# Patient Record
Sex: Female | Born: 1995 | Race: Black or African American | Hispanic: No | Marital: Single | Smoking: Never smoker
Health system: Southern US, Community
[De-identification: ages and names within clinical notes are randomized; demographics above are authoritative.]

## PROBLEM LIST (undated history)

## (undated) DIAGNOSIS — I10 Essential (primary) hypertension: Secondary | ICD-10-CM

## (undated) HISTORY — PX: TONSILLECTOMY: SUR1361

## (undated) HISTORY — PX: MOUTH SURGERY: SHX715

---

## 2013-07-26 ENCOUNTER — Encounter (HOSPITAL_BASED_OUTPATIENT_CLINIC_OR_DEPARTMENT_OTHER): Payer: Self-pay | Admitting: Emergency Medicine

## 2013-07-26 ENCOUNTER — Emergency Department (HOSPITAL_BASED_OUTPATIENT_CLINIC_OR_DEPARTMENT_OTHER)
Admission: EM | Admit: 2013-07-26 | Discharge: 2013-07-26 | Disposition: A | Discharge status: Home / Self Care | Payer: Medicaid Other | Attending: Emergency Medicine | Admitting: Emergency Medicine

## 2013-07-26 ENCOUNTER — Emergency Department (HOSPITAL_BASED_OUTPATIENT_CLINIC_OR_DEPARTMENT_OTHER): Payer: Medicaid Other

## 2013-07-26 DIAGNOSIS — R1084 Generalized abdominal pain: Secondary | ICD-10-CM | POA: Insufficient documentation

## 2013-07-26 DIAGNOSIS — Z3490 Encounter for supervision of normal pregnancy, unspecified, unspecified trimester: Secondary | ICD-10-CM

## 2013-07-26 DIAGNOSIS — O9989 Other specified diseases and conditions complicating pregnancy, childbirth and the puerperium: Secondary | ICD-10-CM | POA: Insufficient documentation

## 2013-07-26 LAB — PREGNANCY, URINE: Preg Test, Ur: POSITIVE — AB

## 2013-07-26 LAB — URINALYSIS, ROUTINE W REFLEX MICROSCOPIC
Bilirubin Urine: NEGATIVE
Glucose, UA: NEGATIVE mg/dL
HGB URINE DIPSTICK: NEGATIVE
Ketones, ur: NEGATIVE mg/dL
Leukocytes, UA: NEGATIVE
Nitrite: NEGATIVE
PROTEIN: NEGATIVE mg/dL
Specific Gravity, Urine: 1.017 (ref 1.005–1.030)
Urobilinogen, UA: 0.2 mg/dL (ref 0.0–1.0)
pH: 7.5 (ref 5.0–8.0)

## 2013-07-26 LAB — HCG, QUANTITATIVE, PREGNANCY: hCG, Beta Chain, Quant, S: 2116 m[IU]/mL — ABNORMAL HIGH (ref <5)

## 2013-07-26 MED ORDER — PRENATAL COMPLETE 14-0.4 MG PO TABS: 1.0000 | ORAL_TABLET | Freq: Every morning | ORAL | Status: DC

## 2013-07-26 NOTE — ED Notes (Addendum)
Pt reports has had abd pain in right pelvic area, nausea, and right flank pain.  Has had subjective fever and ha.  Also reports has had whitish vaginal discharge.

## 2013-07-26 NOTE — ED Notes (Addendum)
PA at bedside giving detailed Dc instructions.

## 2013-07-26 NOTE — ED Provider Notes (Signed)
CSN: [id number]     Arrival date & time 07/26/13  1314 History   First MD Initiated Contact with Patient 07/26/13 1344     Chief Complaint  Patient presents with  . Pelvic Pain   (Consider location/radiation/quality/duration/timing/severity/associated sxs/prior Treatment) Patient is a 18 y.o. female presenting with abdominal pain. The history is provided by the patient. No language interpreter was used.  Abdominal Pain Pain location:  Generalized Pain quality: aching   Pain radiates to:  Does not radiate Pain severity:  No pain Timing:  Constant Progression:  Worsening Chronicity:  New Relieved by:  Nothing Worsened by:  Nothing tried Associated symptoms: no vomiting   Risk factors: pregnancy     History reviewed. No pertinent past medical history. Past Surgical History  Procedure Laterality Date  . Tonsillectomy     No family history on file. History  Substance Use Topics  . Smoking status: Never Smoker   . Smokeless tobacco: Not on file  . Alcohol Use: No   OB History   Grav Para Term Preterm Abortions TAB SAB Ect Mult Living                 Review of Systems  Gastrointestinal: Positive for abdominal pain. Negative for vomiting.  Genitourinary: Negative for flank pain and vaginal pain.  All other systems reviewed and are negative.    Allergies  Review of patient's allergies indicates no known allergies.  Home Medications  No current outpatient prescriptions on file. BP 148/74  Pulse 87  Temp(Src) 97.6 F (36.4 C) (Oral)  Resp 18  Ht 5\' 3"  (1.6 m)  SpO2 100%  LMP 06/20/2013 Physical Exam  Nursing note and vitals reviewed. Constitutional: She is oriented to person, place, and time. She appears well-developed and well-nourished.  HENT:  Head: Normocephalic and atraumatic.  Right Ear: External ear normal.  Left Ear: External ear normal.  Eyes: Conjunctivae and EOM are normal. Pupils are equal, round, and reactive to light.  Neck: Normal range of  motion. Neck supple.  Pulmonary/Chest: Effort normal and breath sounds normal.  Abdominal: Soft. She exhibits no distension.  Musculoskeletal: Normal range of motion.  Neurological: She is alert and oriented to person, place, and time. She has normal reflexes.  Skin: Skin is warm.  Psychiatric: She has a normal mood and affect.    ED Course  Procedures (including critical care time) Labs Review Labs Reviewed  PREGNANCY, URINE - Abnormal; Notable for the following:    Preg Test, Ur POSITIVE (*)    All other components within normal limits  URINALYSIS, ROUTINE W REFLEX MICROSCOPIC   Imaging Review No results found.  EKG Interpretation   None       MDM   1. Pregnancy    hcg 2116   Ultrasound shows 5w 2 day.   Pt advised of need for followup ultrasound.   [facility]  Lonia SkinnerLeslie K BataviaSofia, PA-C 07/26/13 [phone]

## 2013-07-26 NOTE — ED Notes (Signed)
Patient transported to u/s ambulatory with tech.

## 2013-07-26 NOTE — Discharge Instructions (Signed)

## 2013-07-26 NOTE — ED Provider Notes (Signed)
Medical screening examination/treatment/procedure(s) were performed by non-physician practitioner and as supervising physician I was immediately available for consultation/collaboration.   Gabriele Loveland L Sejla Marzano, MD 07/26/13 2137 

## 2014-08-01 ENCOUNTER — Emergency Department (HOSPITAL_BASED_OUTPATIENT_CLINIC_OR_DEPARTMENT_OTHER)
Admission: EM | Admit: 2014-08-01 | Discharge: 2014-08-01 | Disposition: A | Discharge status: Home / Self Care | Payer: Medicaid Other | Attending: Emergency Medicine | Admitting: Emergency Medicine

## 2014-08-01 ENCOUNTER — Encounter (HOSPITAL_BASED_OUTPATIENT_CLINIC_OR_DEPARTMENT_OTHER): Payer: Self-pay

## 2014-08-01 DIAGNOSIS — N898 Other specified noninflammatory disorders of vagina: Secondary | ICD-10-CM | POA: Diagnosis present

## 2014-08-01 DIAGNOSIS — I10 Essential (primary) hypertension: Secondary | ICD-10-CM | POA: Insufficient documentation

## 2014-08-01 DIAGNOSIS — N760 Acute vaginitis: Secondary | ICD-10-CM | POA: Insufficient documentation

## 2014-08-01 DIAGNOSIS — Z3202 Encounter for pregnancy test, result negative: Secondary | ICD-10-CM | POA: Insufficient documentation

## 2014-08-01 DIAGNOSIS — B9689 Other specified bacterial agents as the cause of diseases classified elsewhere: Secondary | ICD-10-CM

## 2014-08-01 LAB — URINALYSIS, ROUTINE W REFLEX MICROSCOPIC
Bilirubin Urine: NEGATIVE
Glucose, UA: NEGATIVE mg/dL
Hgb urine dipstick: NEGATIVE
Ketones, ur: NEGATIVE mg/dL
Leukocytes, UA: NEGATIVE
Nitrite: NEGATIVE
Protein, ur: NEGATIVE mg/dL
SPECIFIC GRAVITY, URINE: 1.035 — AB (ref 1.005–1.030)
Urobilinogen, UA: 0.2 mg/dL (ref 0.0–1.0)
pH: 6.5 (ref 5.0–8.0)

## 2014-08-01 LAB — WET PREP, GENITAL
Trich, Wet Prep: NONE SEEN
Yeast Wet Prep HPF POC: NONE SEEN

## 2014-08-01 LAB — PREGNANCY, URINE: Preg Test, Ur: NEGATIVE

## 2014-08-01 MED ORDER — METRONIDAZOLE 500 MG PO TABS: 500.0000 mg | ORAL_TABLET | Freq: Two times a day (BID) | ORAL | Status: DC

## 2014-08-01 NOTE — ED Provider Notes (Signed)
CSN: [id number]     Arrival date & time 08/01/14  1505 History   First MD Initiated Contact with Patient 08/01/14 1607     Chief Complaint  Patient presents with  . Vaginal Discharge     (Consider location/radiation/quality/duration/timing/severity/associated sxs/prior Treatment) Patient is a 19 y.o. female presenting with vaginal discharge.  Vaginal Discharge Quality:  White, thick and malodorous Severity:  Mild Onset quality:  Gradual Duration:  1 week Timing:  Intermittent Progression:  Unchanged Chronicity:  New Context: at rest   Context: not after intercourse, not after urination, not during bowel movement, not during intercourse, not during pregnancy, not during urination, not genital trauma, not recent antibiotic use and not spontaneously   Relieved by:  Nothing Worsened by:  Nothing tried Ineffective treatments:  None tried Associated symptoms: no abdominal pain, no dyspareunia, no dysuria, no fever, no genital lesions, no nausea, no rash, no urinary frequency, no urinary hesitancy, no urinary incontinence, no vaginal itching and no vomiting   Risk factors: unprotected sex       No past medical history on file. Past Surgical History  Procedure Laterality Date  . Tonsillectomy     No family history on file. History  Substance Use Topics  . Smoking status: Never Smoker   . Smokeless tobacco: Not on file  . Alcohol Use: No   OB History    No data available     Review of Systems  Constitutional: Negative for fever and chills.  HENT: Negative for trouble swallowing.   Respiratory: Negative for shortness of breath.   Cardiovascular: Negative for chest pain.  Gastrointestinal: Negative for nausea, vomiting, abdominal pain, diarrhea and constipation.  Genitourinary: Positive for vaginal discharge. Negative for bladder incontinence, dysuria, hesitancy, hematuria and dyspareunia.  Musculoskeletal: Negative for myalgias and arthralgias.  Skin: Negative for rash.   Neurological: Negative for numbness.  Psychiatric/Behavioral: Negative.   All other systems reviewed and are negative.     Allergies  Review of patient's allergies indicates no known allergies.  Home Medications   Prior to Admission medications   Not on File   BP 162/101 mmHg  Pulse 80  Temp(Src) 98.9 F (37.2 C) (Oral)  Resp 16  Ht 5\' 3"  (1.6 m)  Wt 220 lb (99.791 kg)  BMI 38.98 kg/m2  SpO2 100%  LMP 07/04/2014 Physical Exam  Constitutional: She is oriented to person, place, and time. She appears well-developed and well-nourished. No distress.  HENT:  Head: Normocephalic and atraumatic.  Eyes: Conjunctivae are normal. No scleral icterus.  Neck: Normal range of motion.  Cardiovascular: Normal rate, regular rhythm and normal heart sounds.  Exam reveals no gallop and no friction rub.   No murmur heard. Pulmonary/Chest: Effort normal and breath sounds normal. No respiratory distress.  Abdominal: Soft. Bowel sounds are normal. She exhibits no distension and no mass. There is no tenderness. There is no guarding.  Genitourinary:  Pelvic exam: normal external genitalia, vulva, vagina, cervix, uterus and adnexa.   Neurological: She is alert and oriented to person, place, and time.  Skin: Skin is warm and dry. She is not diaphoretic.    ED Course  Procedures (including critical care time) Labs Review Labs Reviewed  WET PREP, GENITAL - Abnormal; Notable for the following:    Clue Cells Wet Prep HPF POC MANY (*)    WBC, Wet Prep HPF POC FEW (*)    All other components within normal limits  URINALYSIS, ROUTINE W REFLEX MICROSCOPIC - Abnormal; Notable for the following:  Specific Gravity, Urine 1.035 (*)    All other components within normal limits  PREGNANCY, URINE  HIV ANTIBODY (ROUTINE TESTING)  RPR  GC/CHLAMYDIA PROBE AMP ([place])    Imaging Review No results found.   EKG Interpretation None      MDM   Final diagnoses:  None   Patient with  benign pelvic examination. She is hypertensive. She'll need follow-up with primary care physician for this. He shows bacterial vaginosis. No signs of STD on examination. Urine is without signs of infection. She appears safe for discharge at this time.      Arthor Captain, PA-C 08/02/14 1556  Vanetta Mulders, MD 08/04/14 2151

## 2014-08-01 NOTE — Discharge Instructions (Signed)
You haven't been diagnosed today with bacterial vaginosis. Your gonorrhea and chlamydia tests are still pending. There is no wheezing on my examination to feel that you have these and so you are not going to be treated for them today. Bacterial vaginosis is not a sexually transmitted disease. It is caused by an overgrowth of bacteria that normally in the vagina. This can happen due to a change in the pH balance of the vagina. It can be caused by bleeding, sexual intercourse, medications. I'm discharging you with a medication that should never be taken with any alcohol. Is imperative that you do not drink alcohol during the treatment. Please follow-up your OB/GYN provider for further treatment if necessary. He was also found to be with very high blood pressure today. He must follow-up for further treatment. More information as below.  Bacterial Vaginosis Bacterial vaginosis is a vaginal infection that occurs when the normal balance of bacteria in the vagina is disrupted. It results from an overgrowth of certain bacteria. This is the most common vaginal infection in women of childbearing age. Treatment is important to prevent complications, especially in pregnant women, as it can cause a premature delivery. CAUSES  Bacterial vaginosis is caused by an increase in harmful bacteria that are normally present in smaller amounts in the vagina. Several different kinds of bacteria can cause bacterial vaginosis. However, the reason that the condition develops is not fully understood. RISK FACTORS Certain activities or behaviors can put you at an increased risk of developing bacterial vaginosis, including:  Having a new sex partner or multiple sex partners.  Douching.  Using an intrauterine device (IUD) for contraception. Women do not get bacterial vaginosis from toilet seats, bedding, swimming pools, or contact with objects around them. SIGNS AND SYMPTOMS  Some women with bacterial vaginosis have no signs or  symptoms. Common symptoms include:  Grey vaginal discharge.  A fishlike odor with discharge, especially after sexual intercourse.  Itching or burning of the vagina and vulva.  Burning or pain with urination. DIAGNOSIS  Your health care provider will take a medical history and examine the vagina for signs of bacterial vaginosis. A sample of vaginal fluid may be taken. Your health care provider will look at this sample under a microscope to check for bacteria and abnormal cells. A vaginal pH test may also be done.  TREATMENT  Bacterial vaginosis may be treated with antibiotic medicines. These may be given in the form of a pill or a vaginal cream. A second round of antibiotics may be prescribed if the condition comes back after treatment.  HOME CARE INSTRUCTIONS   Only take over-the-counter or prescription medicines as directed by your health care provider.  If antibiotic medicine was prescribed, take it as directed. Make sure you finish it even if you start to feel better.  Do not have sex until treatment is completed.  Tell all sexual partners that you have a vaginal infection. They should see their health care provider and be treated if they have problems, such as a mild rash or itching.  Practice safe sex by using condoms and only having one sex partner. SEEK MEDICAL CARE IF:   Your symptoms are not improving after 3 days of treatment.  You have increased discharge or pain.  You have a fever. MAKE SURE YOU:   Understand these instructions.  Will watch your condition.  Will get help right away if you are not doing well or get worse. FOR MORE INFORMATION  Centers for Disease Control  and Prevention, Division of STD Prevention: SolutionApps.co.zawww.cdc.gov/std American Sexual Health Association (ASHA): www.ashastd.org  Document Released: 06/22/2005 Document Revised: 04/12/2013 Document Reviewed: 02/01/2013 [facility] Patient Information 2015 [place], [place]. This information is not intended to  replace advice given to you by your health care provider. Make sure you discuss any questions you have with your health care provider. Metronidazole tablets or capsules What is this medicine? METRONIDAZOLE (me troe NI da zole) is an antiinfective. It is used to treat certain kinds of bacterial and protozoal infections. It will not work for colds, flu, or other viral infections. This medicine may be used for other purposes; ask your health care provider or pharmacist if you have questions. COMMON BRAND NAME(S): Flagyl What should I tell my health care provider before I take this medicine? They need to know if you have any of these conditions: -anemia or other blood disorders -disease of the nervous system -fungal or yeast infection -if you drink alcohol containing drinks -liver disease -seizures -an unusual or allergic reaction to metronidazole, or other medicines, foods, dyes, or preservatives -pregnant or trying to get pregnant -breast-feeding How should I use this medicine? Take this medicine by mouth with a full glass of water. Follow the directions on the prescription label. Take your medicine at regular intervals. Do not take your medicine more often than directed. Take all of your medicine as directed even if you think you are better. Do not skip doses or stop your medicine early. Talk to your pediatrician regarding the use of this medicine in children. Special care may be needed. Overdosage: If you think you have taken too much of this medicine contact a poison control center or emergency room at once. NOTE: This medicine is only for you. Do not share this medicine with others. What if I miss a dose? If you miss a dose, take it as soon as you can. If it is almost time for your next dose, take only that dose. Do not take double or extra doses. What may interact with this medicine? Do not take this medicine with any of the following medications: -alcohol or any product that contains  alcohol -amprenavir oral solution -cisapride -disulfiram -dofetilide -dronedarone -paclitaxel injection -pimozide -ritonavir oral solution -sertraline oral solution -sulfamethoxazole-trimethoprim injection -thioridazine -ziprasidone This medicine may also interact with the following medications: -birth control pills -cimetidine -lithium -other medicines that prolong the QT interval (cause an abnormal heart rhythm) -phenobarbital -phenytoin -warfarin This list may not describe all possible interactions. Give your health care provider a list of all the medicines, herbs, non-prescription drugs, or dietary supplements you use. Also tell them if you smoke, drink alcohol, or use illegal drugs. Some items may interact with your medicine. What should I watch for while using this medicine? Tell your doctor or health care professional if your symptoms do not improve or if they get worse. You may get drowsy or dizzy. Do not drive, use machinery, or do anything that needs mental alertness until you know how this medicine affects you. Do not stand or sit up quickly, especially if you are an older patient. This reduces the risk of dizzy or fainting spells. Avoid alcoholic drinks while you are taking this medicine and for three days afterward. Alcohol may make you feel dizzy, sick, or flushed. If you are being treated for a sexually transmitted disease, avoid sexual contact until you have finished your treatment. Your sexual partner may also need treatment. What side effects may I notice from receiving this medicine? Side effects  that you should report to your doctor or health care professional as soon as possible: -allergic reactions like skin rash or hives, swelling of the face, lips, or tongue -confusion, clumsiness -difficulty speaking -discolored or sore mouth -dizziness -fever, infection -numbness, tingling, pain or weakness in the hands or feet -trouble passing urine or change in the  amount of urine -redness, blistering, peeling or loosening of the skin, including inside the mouth -seizures -unusually weak or tired -vaginal irritation, dryness, or discharge Side effects that usually do not require medical attention (report to your doctor or health care professional if they continue or are bothersome): -diarrhea -headache -irritability -metallic taste -nausea -stomach pain or cramps -trouble sleeping This list may not describe all possible side effects. Call your doctor for medical advice about side effects. You may report side effects to FDA at 1-800-FDA-1088. Where should I keep my medicine? Keep out of the reach of children. Store at room temperature below 25 degrees C (77 degrees F). Protect from light. Keep container tightly closed. Throw away any unused medicine after the expiration date. NOTE: This sheet is a summary. It may not cover all possible information. If you have questions about this medicine, talk to your doctor, pharmacist, or health care provider.  2015, Elsevier/Gold Standard. (2013-01-27 14:08:39)  DASH Eating Plan DASH stands for "Dietary Approaches to Stop Hypertension." The DASH eating plan is a healthy eating plan that has been shown to reduce high blood pressure (hypertension). Additional health benefits may include reducing the risk of type 2 diabetes mellitus, heart disease, and stroke. The DASH eating plan may also help with weight loss. WHAT DO I NEED TO KNOW ABOUT THE DASH EATING PLAN? For the DASH eating plan, you will follow these general guidelines:  Choose foods with a percent daily value for sodium of less than 5% (as listed on the food label).  Use salt-free seasonings or herbs instead of table salt or sea salt.  Check with your health care provider or pharmacist before using salt substitutes.  Eat lower-sodium products, often labeled as "lower sodium" or "no salt added."  Eat fresh foods.  Eat more vegetables, fruits, and  low-fat dairy products.  Choose whole grains. Look for the word "whole" as the first word in the ingredient list.  Choose fish and skinless chicken or [place] more often than red meat. Limit fish, poultry, and meat to 6 oz (170 g) each day.  Limit sweets, desserts, sugars, and sugary drinks.  Choose heart-healthy fats.  Limit cheese to 1 oz (28 g) per day.  Eat more home-cooked food and less restaurant, buffet, and fast food.  Limit fried foods.  Cook foods using methods other than frying.  Limit canned vegetables. If you do use them, rinse them well to decrease the sodium.  When eating at a restaurant, ask that your food be prepared with less salt, or no salt if possible. WHAT FOODS CAN I EAT? Seek help from a dietitian for individual calorie needs. Grains Whole grain or whole wheat bread. Brown rice. Whole grain or whole wheat pasta. Quinoa, bulgur, and whole grain cereals. Low-sodium cereals. Corn or whole wheat flour tortillas. Whole grain cornbread. Whole grain crackers. Low-sodium crackers. Vegetables Fresh or frozen vegetables (raw, steamed, roasted, or grilled). Low-sodium or reduced-sodium tomato and vegetable juices. Low-sodium or reduced-sodium tomato sauce and paste. Low-sodium or reduced-sodium canned vegetables.  Fruits All fresh, canned (in natural juice), or frozen fruits. Meat and Other Protein Products Ground beef (85% or leaner), grass-fed beef,  or beef trimmed of fat. Skinless chicken or [place]. Ground chicken or [place]. Pork trimmed of fat. All fish and seafood. Eggs. Dried beans, peas, or lentils. Unsalted nuts and seeds. Unsalted canned beans. Dairy Low-fat dairy products, such as skim or 1% milk, 2% or reduced-fat cheeses, low-fat ricotta or cottage cheese, or plain low-fat yogurt. Low-sodium or reduced-sodium cheeses. Fats and Oils Tub margarines without trans fats. Light or reduced-fat mayonnaise and salad dressings (reduced sodium). Avocado. Safflower,  olive, or canola oils. Natural peanut or almond butter. Other Unsalted popcorn and pretzels. The items listed above may not be a complete list of recommended foods or beverages. Contact your dietitian for more options. WHAT FOODS ARE NOT RECOMMENDED? Grains White bread. White pasta. White rice. Refined cornbread. Bagels and croissants. Crackers that contain trans fat. Vegetables Creamed or fried vegetables. Vegetables in a cheese sauce. Regular canned vegetables. Regular canned tomato sauce and paste. Regular tomato and vegetable juices. Fruits Dried fruits. Canned fruit in light or heavy syrup. Fruit juice. Meat and Other Protein Products Fatty cuts of meat. Ribs, chicken wings, bacon, sausage, bologna, salami, chitterlings, fatback, hot dogs, bratwurst, and packaged luncheon meats. Salted nuts and seeds. Canned beans with salt. Dairy Whole or 2% milk, cream, half-and-half, and cream cheese. Whole-fat or sweetened yogurt. Full-fat cheeses or blue cheese. Nondairy creamers and whipped toppings. Processed cheese, cheese spreads, or cheese curds. Condiments Onion and garlic salt, seasoned salt, table salt, and sea salt. Canned and packaged gravies. Worcestershire sauce. Tartar sauce. Barbecue sauce. Teriyaki sauce. Soy sauce, including reduced sodium. Steak sauce. Fish sauce. Oyster sauce. Cocktail sauce. Horseradish. Ketchup and mustard. Meat flavorings and tenderizers. Bouillon cubes. Hot sauce. Tabasco sauce. Marinades. Taco seasonings. Relishes. Fats and Oils Butter, stick margarine, lard, shortening, ghee, and bacon fat. Coconut, palm kernel, or palm oils. Regular salad dressings. Other Pickles and olives. Salted popcorn and pretzels. The items listed above may not be a complete list of foods and beverages to avoid. Contact your dietitian for more information. WHERE CAN I FIND MORE INFORMATION? National Heart, Lung, and Blood Institute:  CablePromo.it Document Released: 06/11/2011 Document Revised: 11/06/2013 Document Reviewed: 04/26/2013 [facility] Patient Information 2015 [place], [place]. This information is not intended to replace advice given to you by your health care provider. Make sure you discuss any questions you have with your health care provider.  Hypertension Hypertension, commonly called high blood pressure, is when the force of blood pumping through your arteries is too strong. Your arteries are the blood vessels that carry blood from your heart throughout your body. A blood pressure reading consists of a higher number over a lower number, such as 110/72. The higher number (systolic) is the pressure inside your arteries when your heart pumps. The lower number (diastolic) is the pressure inside your arteries when your heart relaxes. Ideally you want your blood pressure below 120/80. Hypertension forces your heart to work harder to pump blood. Your arteries may become narrow or stiff. Having hypertension puts you at risk for heart disease, stroke, and other problems.  RISK FACTORS Some risk factors for high blood pressure are controllable. Others are not.  Risk factors you cannot control include:   Race. You may be at higher risk if you are African American.  Age. Risk increases with age.  Gender. Men are at higher risk than women before age 74 years. After age 12, women are at higher risk than men. Risk factors you can control include:  Not getting enough exercise or physical activity.  Being overweight.  Getting too much fat, sugar, calories, or salt in your diet.  Drinking too much alcohol. SIGNS AND SYMPTOMS Hypertension does not usually cause signs or symptoms. Extremely high blood pressure (hypertensive crisis) may cause headache, anxiety, shortness of breath, and nosebleed. DIAGNOSIS  To check if you have hypertension, your health care provider will measure your  blood pressure while you are seated, with your arm held at the level of your heart. It should be measured at least twice using the same arm. Certain conditions can cause a difference in blood pressure between your right and left arms. A blood pressure reading that is higher than normal on one occasion does not mean that you need treatment. If one blood pressure reading is high, ask your health care provider about having it checked again. TREATMENT  Treating high blood pressure includes making lifestyle changes and possibly taking medicine. Living a healthy lifestyle can help lower high blood pressure. You may need to change some of your habits. Lifestyle changes may include:  Following the DASH diet. This diet is high in fruits, vegetables, and whole grains. It is low in salt, red meat, and added sugars.  Getting at least 2 hours of brisk physical activity every week.  Losing weight if necessary.  Not smoking.  Limiting alcoholic beverages.  Learning ways to reduce stress. If lifestyle changes are not enough to get your blood pressure under control, your health care provider may prescribe medicine. You may need to take more than one. Work closely with your health care provider to understand the risks and benefits. HOME CARE INSTRUCTIONS  Have your blood pressure rechecked as directed by your health care provider.   Take medicines only as directed by your health care provider. Follow the directions carefully. Blood pressure medicines must be taken as prescribed. The medicine does not work as well when you skip doses. Skipping doses also puts you at risk for problems.   Do not smoke.   Monitor your blood pressure at home as directed by your health care provider. SEEK MEDICAL CARE IF:   You think you are having a reaction to medicines taken.  You have recurrent headaches or feel dizzy.  You have swelling in your ankles.  You have trouble with your vision. SEEK IMMEDIATE MEDICAL  CARE IF:  You develop a severe headache or confusion.  You have unusual weakness, numbness, or feel faint.  You have severe chest or abdominal pain.  You vomit repeatedly.  You have trouble breathing. MAKE SURE YOU:   Understand these instructions.  Will watch your condition.  Will get help right away if you are not doing well or get worse. Document Released: 06/22/2005 Document Revised: 11/06/2013 Document Reviewed: 04/14/2013 [facility] Patient Information 2015 [place], [place]. This information is not intended to replace advice given to you by your health care provider. Make sure you discuss any questions you have with your health care provider.  Managing Your High Blood Pressure Blood pressure is a measurement of how forceful your blood is pressing against the walls of the arteries. Arteries are muscular tubes within the circulatory system. Blood pressure does not stay the same. Blood pressure rises when you are active, excited, or nervous; and it lowers during sleep and relaxation. If the numbers measuring your blood pressure stay above normal most of the time, you are at risk for health problems. High blood pressure (hypertension) is a long-term (chronic) condition in which blood pressure is elevated. A blood pressure reading is recorded  as two numbers, such as 120 over 80 (or 120/80). The first, higher number is called the systolic pressure. It is a measure of the pressure in your arteries as the heart beats. The second, lower number is called the diastolic pressure. It is a measure of the pressure in your arteries as the heart relaxes between beats.  Keeping your blood pressure in a normal range is important to your overall health and prevention of health problems, such as heart disease and stroke. When your blood pressure is uncontrolled, your heart has to work harder than normal. High blood pressure is a very common condition in adults because blood pressure tends to rise with age. Men  and women are equally likely to have hypertension but at different times in life. Before age 61, men are more likely to have hypertension. After 21 years of age, women are more likely to have it. Hypertension is especially common in African Americans. This condition often has no signs or symptoms. The cause of the condition is usually not known. Your caregiver can help you come up with a plan to keep your blood pressure in a normal, healthy range. BLOOD PRESSURE STAGES Blood pressure is classified into four stages: normal, prehypertension, stage 1, and stage 2. Your blood pressure reading will be used to determine what type of treatment, if any, is necessary. Appropriate treatment options are tied to these four stages:  Normal  Systolic pressure (mm Hg): below 120.  Diastolic pressure (mm Hg): below 80. Prehypertension  Systolic pressure (mm Hg): 120 to 139.  Diastolic pressure (mm Hg): 80 to 89. Stage1  Systolic pressure (mm Hg): 140 to 159.  Diastolic pressure (mm Hg): 90 to 99. Stage2  Systolic pressure (mm Hg): 160 or above.  Diastolic pressure (mm Hg): 100 or above. RISKS RELATED TO HIGH BLOOD PRESSURE Managing your blood pressure is an important responsibility. Uncontrolled high blood pressure can lead to:  A heart attack.  A stroke.  A weakened blood vessel (aneurysm).  Heart failure.  Kidney damage.  Eye damage.  Metabolic syndrome.  Memory and concentration problems. HOW TO MANAGE YOUR BLOOD PRESSURE Blood pressure can be managed effectively with lifestyle changes and medicines (if needed). Your caregiver will help you come up with a plan to bring your blood pressure within a normal range. Your plan should include the following: Education  Read all information provided by your caregivers about how to control blood pressure.  Educate yourself on the latest guidelines and treatment recommendations. New research is always being done to further define the risks  and treatments for high blood pressure. Lifestylechanges  Control your weight.  Avoid smoking.  Stay physically active.  Reduce the amount of salt in your diet.  Reduce stress.  Control any chronic conditions, such as high cholesterol or diabetes.  Reduce your alcohol intake. Medicines  Several medicines (antihypertensive medicines) are available, if needed, to bring blood pressure within a normal range. Communication  Review all the medicines you take with your caregiver because there may be side effects or interactions.  Talk with your caregiver about your diet, exercise habits, and other lifestyle factors that may be contributing to high blood pressure.  See your caregiver regularly. Your caregiver can help you create and adjust your plan for managing high blood pressure. RECOMMENDATIONS FOR TREATMENT AND FOLLOW-UP  The following recommendations are based on current guidelines for managing high blood pressure in nonpregnant adults. Use these recommendations to identify the proper follow-up period or treatment option based  on your blood pressure reading. You can discuss these options with your caregiver.  Systolic pressure of 120 to 139 or diastolic pressure of 80 to 89: Follow up with your caregiver as directed.  Systolic pressure of 140 to 160 or diastolic pressure of 90 to 100: Follow up with your caregiver within 2 months.  Systolic pressure above 160 or diastolic pressure above 100: Follow up with your caregiver within 1 month.  Systolic pressure above 180 or diastolic pressure above 110: Consider antihypertensive therapy; follow up with your caregiver within 1 week.  Systolic pressure above 200 or diastolic pressure above 120: Begin antihypertensive therapy; follow up with your caregiver within 1 week. Document Released: 03/16/2012 Document Reviewed: 03/16/2012 [facility] Patient Information 2015 [place], [place]. This information is not intended to replace advice given  to you by your health care provider. Make sure you discuss any questions you have with your health care provider.

## 2014-08-01 NOTE — ED Notes (Signed)
Vaginal d/c x 1 week  

## 2014-08-03 LAB — HIV ANTIBODY (ROUTINE TESTING W REFLEX): HIV Screen 4th Generation wRfx: NONREACTIVE

## 2014-08-03 LAB — RPR: RPR: NONREACTIVE

## 2014-08-28 ENCOUNTER — Emergency Department (HOSPITAL_BASED_OUTPATIENT_CLINIC_OR_DEPARTMENT_OTHER)
Admission: EM | Admit: 2014-08-28 | Discharge: 2014-08-28 | Disposition: A | Discharge status: Home / Self Care | Payer: Medicaid Other | Attending: Emergency Medicine | Admitting: Emergency Medicine

## 2014-08-28 ENCOUNTER — Encounter (HOSPITAL_BASED_OUTPATIENT_CLINIC_OR_DEPARTMENT_OTHER): Payer: Self-pay

## 2014-08-28 DIAGNOSIS — J069 Acute upper respiratory infection, unspecified: Secondary | ICD-10-CM | POA: Diagnosis not present

## 2014-08-28 DIAGNOSIS — J029 Acute pharyngitis, unspecified: Secondary | ICD-10-CM | POA: Insufficient documentation

## 2014-08-28 LAB — RAPID STREP SCREEN (MED CTR MEBANE ONLY): STREPTOCOCCUS, GROUP A SCREEN (DIRECT): NEGATIVE

## 2014-08-28 NOTE — ED Notes (Signed)
Sore throat x today

## 2014-08-28 NOTE — ED Provider Notes (Signed)
CSN: [id number]     Arrival date & time 08/28/14  1733 History   First MD Initiated Contact with Patient 08/28/14 1749     Chief Complaint  Patient presents with  . Sore Throat     (Consider location/radiation/quality/duration/timing/severity/associated sxs/prior Treatment) HPI Comments: 19 year old female presenting with sore throat 1 day. Admits to associated cough productive with phlegm, nasal congestion and subjective fevers. Sore throat worse with swelling, however denies any difficulty swallowing. Her daughter is sick with nasal congestion and cough and has a niece who is also sick with similar symptoms. She has not tried any alleviating factors for her symptoms.  Patient is a 19 y.o. female presenting with pharyngitis. The history is provided by the patient.  Sore Throat Associated symptoms include chills, congestion, coughing, a fever and a sore throat.    History reviewed. No pertinent past medical history. Past Surgical History  Procedure Laterality Date  . Tonsillectomy     No family history on file. History  Substance Use Topics  . Smoking status: Never Smoker   . Smokeless tobacco: Not on file  . Alcohol Use: No   OB History    No data available     Review of Systems  Constitutional: Positive for fever and chills.  HENT: Positive for congestion, rhinorrhea and sore throat.   Respiratory: Positive for cough.   All other systems reviewed and are negative.     Allergies  Review of patient's allergies indicates no known allergies.  Home Medications   Prior to Admission medications   Medication Sig Start Date End Date Taking? Authorizing Provider  LABETALOL HCL PO Take by mouth.   Yes Historical Provider, MD   BP 135/79 mmHg  Pulse 102  Temp(Src) 98 F (36.7 C) (Oral)  Resp 18  Ht 5\' 3"  (1.6 m)  Wt 220 lb (99.791 kg)  BMI 38.98 kg/m2  SpO2 98%  LMP 08/17/2014 Physical Exam  Constitutional: She is oriented to person, place, and time. She appears  well-developed and well-nourished. No distress.  HENT:  Head: Normocephalic and atraumatic.  Nose: Mucosal edema present.  Post oropharyngeal erythema without edema or exudate.  Eyes: Conjunctivae and EOM are normal.  Neck: Normal range of motion. Neck supple.  Cardiovascular: Normal rate, regular rhythm and normal heart sounds.   Pulmonary/Chest: Effort normal and breath sounds normal. No respiratory distress.  Musculoskeletal: Normal range of motion. She exhibits no edema.  Lymphadenopathy:    She has no cervical adenopathy.  Neurological: She is alert and oriented to person, place, and time. No sensory deficit.  Skin: Skin is warm and dry.  Psychiatric: She has a normal mood and affect. Her behavior is normal.  Nursing note and vitals reviewed.   ED Course  Procedures (including critical care time) Labs Review Labs Reviewed  RAPID STREP SCREEN    Imaging Review No results found.   EKG Interpretation None      MDM   Final diagnoses:  Sore throat  Pharyngitis  URI (upper respiratory infection)   NAD. Non-toxic appearing. AFVSS. No tachycardia on my exam. Rapid strep negative. Does not meet Centor criteria for strep treatment. Discussed symptomatic treatment. Stable for discharge. Return precautions given. Patient states understanding of treatment care plan and is agreeable.   Kathrynn SpeedRobyn M Tyresse Jayson, PA-C 08/28/14 1819  Rolland PorterMark James, MD 08/29/14 1500

## 2014-08-28 NOTE — Discharge Instructions (Signed)
Cool Mist Vaporizers Vaporizers may help relieve the symptoms of a cough and cold. They add moisture to the air, which helps mucus to become thinner and less sticky. This makes it easier to breathe and cough up secretions. Cool mist vaporizers do not cause serious burns like hot mist vaporizers, which may also be called steamers or humidifiers. Vaporizers have not been proven to help with colds. You should not use a vaporizer if you are allergic to mold. HOME CARE INSTRUCTIONS  Follow the package instructions for the vaporizer.  Do not use anything other than distilled water in the vaporizer.  Do not run the vaporizer all of the time. This can cause mold or bacteria to grow in the vaporizer.  Clean the vaporizer after each time it is used.  Clean and dry the vaporizer well before storing it.  Stop using the vaporizer if worsening respiratory symptoms develop. Document Released: 03/19/2004 Document Revised: 06/27/2013 Document Reviewed: 11/09/2012 [facility] Patient Information 2015 [place], [place]. This information is not intended to replace advice given to you by your health care provider. Make sure you discuss any questions you have with your health care provider.  Cough, Adult  A cough is a reflex that helps clear your throat and airways. It can help heal the body or may be a reaction to an irritated airway. A cough may only last 2 or 3 weeks (acute) or may last more than 8 weeks (chronic).  CAUSES Acute cough:  Viral or bacterial infections. Chronic cough:  Infections.  Allergies.  Asthma.  Post-nasal drip.  Smoking.  Heartburn or acid reflux.  Some medicines.  Chronic lung problems (COPD).  Cancer. SYMPTOMS   Cough.  Fever.  Chest pain.  Increased breathing rate.  High-pitched whistling sound when breathing (wheezing).  Colored mucus that you cough up (sputum). TREATMENT   A bacterial cough may be treated with antibiotic medicine.  A viral cough must run  its course and will not respond to antibiotics.  Your caregiver may recommend other treatments if you have a chronic cough. HOME CARE INSTRUCTIONS   Only take over-the-counter or prescription medicines for pain, discomfort, or fever as directed by your caregiver. Use cough suppressants only as directed by your caregiver.  Use a cold steam vaporizer or humidifier in your bedroom or home to help loosen secretions.  Sleep in a semi-upright position if your cough is worse at night.  Rest as needed.  Stop smoking if you smoke. SEEK IMMEDIATE MEDICAL CARE IF:   You have pus in your sputum.  Your cough starts to worsen.  You cannot control your cough with suppressants and are losing sleep.  You begin coughing up blood.  You have difficulty breathing.  You develop pain which is getting worse or is uncontrolled with medicine.  You have a fever. MAKE SURE YOU:   Understand these instructions.  Will watch your condition.  Will get help right away if you are not doing well or get worse. Document Released: 12/19/2010 Document Revised: 09/14/2011 Document Reviewed: 12/19/2010 [facility] Patient Information 2015 [place], [place]. This information is not intended to replace advice given to you by your health care provider. Make sure you discuss any questions you have with your health care provider.  Pharyngitis Pharyngitis is redness, pain, and swelling (inflammation) of your pharynx.  CAUSES  Pharyngitis is usually caused by infection. Most of the time, these infections are from viruses (viral) and are part of a cold. However, sometimes pharyngitis is caused by bacteria (bacterial). Pharyngitis  can also be caused by allergies. Viral pharyngitis may be spread from person to person by coughing, sneezing, and personal items or utensils (cups, forks, spoons, toothbrushes). Bacterial pharyngitis may be spread from person to person by more intimate contact, such as kissing.  SIGNS AND SYMPTOMS    Symptoms of pharyngitis include:   Sore throat.   Tiredness (fatigue).   Low-grade fever.   Headache.  Joint pain and muscle aches.  Skin rashes.  Swollen lymph nodes.  Plaque-like film on throat or tonsils (often seen with bacterial pharyngitis). DIAGNOSIS  Your health care provider will ask you questions about your illness and your symptoms. Your medical history, along with a physical exam, is often all that is needed to diagnose pharyngitis. Sometimes, a rapid strep test is done. Other lab tests may also be done, depending on the suspected cause.  TREATMENT  Viral pharyngitis will usually get better in 3-4 days without the use of medicine. Bacterial pharyngitis is treated with medicines that kill germs (antibiotics).  HOME CARE INSTRUCTIONS   Drink enough water and fluids to keep your urine clear or pale yellow.   Only take over-the-counter or prescription medicines as directed by your health care provider:   If you are prescribed antibiotics, make sure you finish them even if you start to feel better.   Do not take aspirin.   Get lots of rest.   Gargle with 8 oz of salt water ( tsp of salt per 1 qt of water) as often as every 1-2 hours to soothe your throat.   Throat lozenges (if you are not at risk for choking) or sprays may be used to soothe your throat. SEEK MEDICAL CARE IF:   You have large, tender lumps in your neck.  You have a rash.  You cough up green, yellow-brown, or bloody spit. SEEK IMMEDIATE MEDICAL CARE IF:   Your neck becomes stiff.  You drool or are unable to swallow liquids.  You vomit or are unable to keep medicines or liquids down.  You have severe pain that does not go away with the use of recommended medicines.  You have trouble breathing (not caused by a stuffy nose). MAKE SURE YOU:   Understand these instructions.  Will watch your condition.  Will get help right away if you are not doing well or get worse. Document  Released: 06/22/2005 Document Revised: 04/12/2013 Document Reviewed: 02/27/2013 [facility] Patient Information 2015 [place], [place]. This information is not intended to replace advice given to you by your health care provider. Make sure you discuss any questions you have with your health care provider.  Sore Throat A sore throat is pain, burning, irritation, or scratchiness of the throat. There is often pain or tenderness when swallowing or talking. A sore throat may be accompanied by other symptoms, such as coughing, sneezing, fever, and swollen neck glands. A sore throat is often the first sign of another sickness, such as a cold, flu, strep throat, or mononucleosis (commonly known as mono). Most sore throats go away without medical treatment. CAUSES  The most common causes of a sore throat include:  A viral infection, such as a cold, flu, or mono.  A bacterial infection, such as strep throat, tonsillitis, or whooping cough.  Seasonal allergies.  Dryness in the air.  Irritants, such as smoke or pollution.  Gastroesophageal reflux disease (GERD). HOME CARE INSTRUCTIONS   Only take over-the-counter medicines as directed by your caregiver.  Drink enough fluids to keep your urine clear or  pale yellow.  Rest as needed.  Try using throat sprays, lozenges, or sucking on hard candy to ease any pain (if older than 4 years or as directed).  Sip warm liquids, such as broth, herbal tea, or warm water with honey to relieve pain temporarily. You may also eat or drink cold or frozen liquids such as frozen ice pops.  Gargle with salt water (mix 1 tsp salt with 8 oz of water).  Do not smoke and avoid secondhand smoke.  Put a cool-mist humidifier in your bedroom at night to moisten the air. You can also turn on a hot shower and sit in the bathroom with the door closed for 5-10 minutes. SEEK IMMEDIATE MEDICAL CARE IF:  You have difficulty breathing.  You are unable to swallow fluids, soft foods,  or your saliva.  You have increased swelling in the throat.  Your sore throat does not get better in 7 days.  You have nausea and vomiting.  You have a fever or persistent symptoms for more than 2-3 days.  You have a fever and your symptoms suddenly get worse. MAKE SURE YOU:   Understand these instructions.  Will watch your condition.  Will get help right away if you are not doing well or get worse. Document Released: 07/30/2004 Document Revised: 06/08/2012 Document Reviewed: 02/28/2012 [facility] Patient Information 2015 [place]. This information is not intended to replace advice given to you by your health care provider. Make sure you discuss any questions you have with your health care provider.

## 2014-08-31 LAB — CULTURE, GROUP A STREP: Strep A Culture: NEGATIVE

## 2014-10-05 ENCOUNTER — Encounter (HOSPITAL_BASED_OUTPATIENT_CLINIC_OR_DEPARTMENT_OTHER): Payer: Self-pay

## 2014-10-05 ENCOUNTER — Emergency Department (HOSPITAL_BASED_OUTPATIENT_CLINIC_OR_DEPARTMENT_OTHER)
Admission: EM | Admit: 2014-10-05 | Discharge: 2014-10-05 | Disposition: A | Discharge status: Home / Self Care | Payer: Medicaid Other | Attending: Emergency Medicine | Admitting: Emergency Medicine

## 2014-10-05 DIAGNOSIS — G4489 Other headache syndrome: Secondary | ICD-10-CM | POA: Insufficient documentation

## 2014-10-05 DIAGNOSIS — R51 Headache: Secondary | ICD-10-CM | POA: Diagnosis present

## 2014-10-05 MED ORDER — ONDANSETRON 8 MG PO TBDP
8.0000 mg | ORAL_TABLET | ORAL | Status: AC
  Administered 2014-10-05: 8 mg via ORAL
  Filled 2014-10-05: qty 1

## 2014-10-05 MED ORDER — IBUPROFEN 800 MG PO TABS: 800.0000 mg | ORAL_TABLET | Freq: Three times a day (TID) | ORAL | Status: AC

## 2014-10-05 MED ORDER — IBUPROFEN 800 MG PO TABS
800.0000 mg | ORAL_TABLET | Freq: Once | ORAL | Status: AC
  Administered 2014-10-05: 800 mg via ORAL
  Filled 2014-10-05: qty 1

## 2014-10-05 MED ORDER — ONDANSETRON HCL 4 MG PO TABS: 4.0000 mg | ORAL_TABLET | Freq: Four times a day (QID) | ORAL | Status: AC

## 2014-10-05 NOTE — ED Notes (Signed)
C/o HA x 2 days-denies n/v

## 2014-10-05 NOTE — ED Notes (Addendum)
Patient reports migraine since Wednesday.  Reports she has been taking ibuprofen and tylenol with no relief.  Denies nausea/vomiting.  Denies dizziness.  Reports sensitivity to light.

## 2014-10-05 NOTE — ED Provider Notes (Signed)
CSN: [id number]     Arrival date & time 10/05/14  2037 History  This chart was scribed for Jerelyn Scott, MD by Roxy Cedar, ED Scribe. This patient was seen in room MH05/MH05 and the patient's care was started at 10:26 PM.   Chief Complaint  Patient presents with  . Headache   Patient is a 19 y.o. female presenting with headaches. The history is provided by the patient. No language interpreter was used.  Headache Pain location:  Generalized Radiates to:  Does not radiate Timing:  Intermittent Progression:  Unchanged Chronicity:  New Similar to prior headaches: yes   Relieved by:  Nothing Worsened by:  Nothing  HPI Comments: Kendra Koch is a 19 y.o. female who presents to the Emergency Department complaining of moderate headache that began 2 days ago. She denies associated nausea, vomiting, sore throat, or fever. She denies hx of migraine headaches. She states she took aspirin and  ibuprofen with no relief. She last took medication at [facility] today. She reports similar headaches in the past but states that she her headache today is more severe than those in the past. She denies high BP in the past few days. Headache was gradual in onset.    History reviewed. No pertinent past medical history. Past Surgical History  Procedure Laterality Date  . Tonsillectomy     No family history on file. History  Substance Use Topics  . Smoking status: Never Smoker   . Smokeless tobacco: Not on file  . Alcohol Use: No   OB History    No data available     Review of Systems  Neurological: Positive for headaches.  All other systems reviewed and are negative.  Allergies  Review of patient's allergies indicates no known allergies.  Home Medications   Prior to Admission medications   Medication Sig Start Date End Date Taking? Authorizing Provider  ibuprofen (ADVIL,MOTRIN) 800 MG tablet Take 1 tablet (800 mg total) by mouth 3 (three) times daily. 10/05/14   Jerelyn Scott, MD   LABETALOL HCL PO Take by mouth.    Historical Provider, MD  ondansetron (ZOFRAN) 4 MG tablet Take 1 tablet (4 mg total) by mouth every 6 (six) hours. 10/05/14   Jerelyn Scott, MD   Triage Vitals: BP 121/65 mmHg  Pulse 76  Temp(Src) 98.5 F (36.9 C) (Oral)  Resp 18  Ht  (1.6 m)  Wt 222 lb (100.699 kg)  BMI 39.34 kg/m2  SpO2 99%  LMP 09/03/2014 Vitals reviewed Physical Exam  Physical Examination: General appearance - alert, well appearing, and in no distress Mental status - alert, oriented to person, place, and time Eyes - pupils equal and reactive, extraocular eye movements intact Mouth - mucous membranes moist, pharynx normal without lesions Neck - supple, no significant adenopathy Chest - clear to auscultation, no wheezes, rales or rhonchi, symmetric air entry Heart - normal rate, regular rhythm, normal S1, S2, no murmurs, rubs, clicks or gallops Neurological - alert, oriented x 3, no cranial nerve deficit, strength 5/5 in extremities x 4, sensation intact Extremities - peripheral pulses normal, no pedal edema, no clubbing or cyanosis Skin - normal coloration and turgor, no rashes  ED Course  Procedures (including critical care time)  DIAGNOSTIC STUDIES: Oxygen Saturation is 99% on RA, normal by my interpretation.    COORDINATION OF CARE: 10:29 PM- Discussed plans to give patient a higher dose of ibuprofen. Patient declines getting an injection of medication. Pt advised of plan for treatment and pt  agrees.  Labs Review Labs Reviewed - No data to display  Imaging Review No results found.   EKG Interpretation None     MDM   Final diagnoses:  Other headache syndrome    Pt presenting with headache over the past 2 days.  Headache was gradual in onset, pt states this feels like a migraine.  Doubt SAH, meningitis or other emergent cause of headache.  Pt states she took one dose of ibuprofen today- states it was 300mg .  D/w patient that she can take 800mg  ibuprofen  and will also give nausea med to help with symptoms.  Normal neuro exam.   Patient is overall nontoxic and well hydrated in appearance.   Discharged with strict return precautions.  Pt agreeable with plan.  I personally performed the services described in this documentation, which was scribed in my presence. The recorded information has been reviewed and is accurate.   Jerelyn ScottMartha Linker, MD 10/06/14 1556

## 2014-10-05 NOTE — ED Notes (Signed)
Patient given warm blanket.

## 2014-10-05 NOTE — ED Notes (Signed)
Patient states "yall going to give me these pills and make me sit here and wait".  Told patient that we would have to reevaluate the effectiveness.  Patient states "well yall should just give me a prescription for some pills and send me home".  Told patient that we need to make sure medication works and then we would reevaluate from there.  Patient states "yall could just give me pills and send me home".  Patient asked for gingerale.  Patient given gingerale.  Friend at the bedside.

## 2014-10-05 NOTE — Discharge Instructions (Signed)
Return to the ED with any concerns including vomiting and not able to keep down liquids, changes in vision or speech, decreased level of alertness/lethargy, or any other alarming symptoms

## 2014-11-03 ENCOUNTER — Encounter (HOSPITAL_BASED_OUTPATIENT_CLINIC_OR_DEPARTMENT_OTHER): Payer: Self-pay | Admitting: Emergency Medicine

## 2014-11-03 ENCOUNTER — Emergency Department (HOSPITAL_BASED_OUTPATIENT_CLINIC_OR_DEPARTMENT_OTHER)
Admission: EM | Admit: 2014-11-03 | Discharge: 2014-11-03 | Disposition: A | Discharge status: Home / Self Care | Payer: Medicaid Other | Attending: Emergency Medicine | Admitting: Emergency Medicine

## 2014-11-03 DIAGNOSIS — N760 Acute vaginitis: Secondary | ICD-10-CM | POA: Diagnosis not present

## 2014-11-03 DIAGNOSIS — N898 Other specified noninflammatory disorders of vagina: Secondary | ICD-10-CM | POA: Diagnosis present

## 2014-11-03 DIAGNOSIS — Z3202 Encounter for pregnancy test, result negative: Secondary | ICD-10-CM | POA: Diagnosis not present

## 2014-11-03 DIAGNOSIS — Z79899 Other long term (current) drug therapy: Secondary | ICD-10-CM | POA: Insufficient documentation

## 2014-11-03 DIAGNOSIS — B9689 Other specified bacterial agents as the cause of diseases classified elsewhere: Secondary | ICD-10-CM

## 2014-11-03 DIAGNOSIS — N390 Urinary tract infection, site not specified: Secondary | ICD-10-CM | POA: Insufficient documentation

## 2014-11-03 DIAGNOSIS — Z791 Long term (current) use of non-steroidal anti-inflammatories (NSAID): Secondary | ICD-10-CM | POA: Diagnosis not present

## 2014-11-03 DIAGNOSIS — Z792 Long term (current) use of antibiotics: Secondary | ICD-10-CM | POA: Insufficient documentation

## 2014-11-03 LAB — URINE MICROSCOPIC-ADD ON

## 2014-11-03 LAB — URINALYSIS, ROUTINE W REFLEX MICROSCOPIC
Glucose, UA: NEGATIVE mg/dL
HGB URINE DIPSTICK: NEGATIVE
KETONES UR: 15 mg/dL — AB
Nitrite: NEGATIVE
PROTEIN: NEGATIVE mg/dL
Specific Gravity, Urine: 1.03 (ref 1.005–1.030)
UROBILINOGEN UA: 1 mg/dL (ref 0.0–1.0)
pH: 6 (ref 5.0–8.0)

## 2014-11-03 LAB — WET PREP, GENITAL: Trich, Wet Prep: NONE SEEN

## 2014-11-03 LAB — PREGNANCY, URINE: Preg Test, Ur: NEGATIVE

## 2014-11-03 MED ORDER — AZITHROMYCIN 250 MG PO TABS
1000.0000 mg | ORAL_TABLET | Freq: Once | ORAL | Status: AC
  Administered 2014-11-03: 1000 mg via ORAL
  Filled 2014-11-03: qty 4

## 2014-11-03 MED ORDER — CEFTRIAXONE SODIUM 250 MG IJ SOLR
250.0000 mg | Freq: Once | INTRAMUSCULAR | Status: AC
  Administered 2014-11-03: 250 mg via INTRAMUSCULAR
  Filled 2014-11-03: qty 250

## 2014-11-03 MED ORDER — SULFAMETHOXAZOLE-TRIMETHOPRIM 800-160 MG PO TABS: 1.0000 | ORAL_TABLET | Freq: Two times a day (BID) | ORAL | Status: AC

## 2014-11-03 MED ORDER — METRONIDAZOLE 500 MG PO TABS: 500.0000 mg | ORAL_TABLET | Freq: Two times a day (BID) | ORAL | Status: AC

## 2014-11-03 NOTE — ED Notes (Signed)
Pt states came to ED w/ c/o vaginal discharge x 1 week

## 2014-11-03 NOTE — Discharge Instructions (Signed)
Bactrim and Flagyl as prescribed.  We will call you if your cultures indicate you require further treatment.   Bacterial Vaginosis Bacterial vaginosis is a vaginal infection that occurs when the normal balance of bacteria in the vagina is disrupted. It results from an overgrowth of certain bacteria. This is the most common vaginal infection in women of childbearing age. Treatment is important to prevent complications, especially in pregnant women, as it can cause a premature delivery. CAUSES  Bacterial vaginosis is caused by an increase in harmful bacteria that are normally present in smaller amounts in the vagina. Several different kinds of bacteria can cause bacterial vaginosis. However, the reason that the condition develops is not fully understood. RISK FACTORS Certain activities or behaviors can put you at an increased risk of developing bacterial vaginosis, including:  Having a new sex partner or multiple sex partners.  Douching.  Using an intrauterine device (IUD) for contraception. Women do not get bacterial vaginosis from toilet seats, bedding, swimming pools, or contact with objects around them. SIGNS AND SYMPTOMS  Some women with bacterial vaginosis have no signs or symptoms. Common symptoms include:  Grey vaginal discharge.  A fishlike odor with discharge, especially after sexual intercourse.  Itching or burning of the vagina and vulva.  Burning or pain with urination. DIAGNOSIS  Your health care provider will take a medical history and examine the vagina for signs of bacterial vaginosis. A sample of vaginal fluid may be taken. Your health care provider will look at this sample under a microscope to check for bacteria and abnormal cells. A vaginal pH test may also be done.  TREATMENT  Bacterial vaginosis may be treated with antibiotic medicines. These may be given in the form of a pill or a vaginal cream. A second round of antibiotics may be prescribed if the condition  comes back after treatment.  HOME CARE INSTRUCTIONS   Only take over-the-counter or prescription medicines as directed by your health care provider.  If antibiotic medicine was prescribed, take it as directed. Make sure you finish it even if you start to feel better.  Do not have sex until treatment is completed.  Tell all sexual partners that you have a vaginal infection. They should see their health care provider and be treated if they have problems, such as a mild rash or itching.  Practice safe sex by using condoms and only having one sex partner. SEEK MEDICAL CARE IF:   Your symptoms are not improving after 3 days of treatment.  You have increased discharge or pain.  You have a fever. MAKE SURE YOU:   Understand these instructions.  Will watch your condition.  Will get help right away if you are not doing well or get worse. FOR MORE INFORMATION  Centers for Disease Control and Prevention, Division of STD Prevention: SolutionApps.co.zawww.cdc.gov/std American Sexual Health Association (ASHA): www.ashastd.org  Document Released: 06/22/2005 Document Revised: 04/12/2013 Document Reviewed: 02/01/2013 [facility] Patient Information 2015 [place], [place]. This information is not intended to replace advice given to you by your health care provider. Make sure you discuss any questions you have with your health care provider.  Urinary Tract Infection Urinary tract infections (UTIs) can develop anywhere along your urinary tract. Your urinary tract is your body's drainage system for removing wastes and extra water. Your urinary tract includes two kidneys, two ureters, a bladder, and a urethra. Your kidneys are a pair of bean-shaped organs. Each kidney is about the size of your fist. They are located below your ribs,  one on each side of your spine. CAUSES Infections are caused by microbes, which are microscopic organisms, including fungi, viruses, and bacteria. These organisms are so small that they can only  be seen through a microscope. Bacteria are the microbes that most commonly cause UTIs. SYMPTOMS  Symptoms of UTIs may vary by age and gender of the patient and by the location of the infection. Symptoms in young women typically include a frequent and intense urge to urinate and a painful, burning feeling in the bladder or urethra during urination. Older women and men are more likely to be tired, shaky, and weak and have muscle aches and abdominal pain. A fever may mean the infection is in your kidneys. Other symptoms of a kidney infection include pain in your back or sides below the ribs, nausea, and vomiting. DIAGNOSIS To diagnose a UTI, your caregiver will ask you about your symptoms. Your caregiver also will ask to provide a urine sample. The urine sample will be tested for bacteria and white blood cells. White blood cells are made by your body to help fight infection. TREATMENT  Typically, UTIs can be treated with medication. Because most UTIs are caused by a bacterial infection, they usually can be treated with the use of antibiotics. The choice of antibiotic and length of treatment depend on your symptoms and the type of bacteria causing your infection. HOME CARE INSTRUCTIONS  If you were prescribed antibiotics, take them exactly as your caregiver instructs you. Finish the medication even if you feel better after you have only taken some of the medication.  Drink enough water and fluids to keep your urine clear or pale yellow.  Avoid caffeine, tea, and carbonated beverages. They tend to irritate your bladder.  Empty your bladder often. Avoid holding urine for long periods of time.  Empty your bladder before and after sexual intercourse.  After a bowel movement, women should cleanse from front to back. Use each tissue only once. SEEK MEDICAL CARE IF:   You have back pain.  You develop a fever.  Your symptoms do not begin to resolve within 3 days. SEEK IMMEDIATE MEDICAL CARE IF:    You have severe back pain or lower abdominal pain.  You develop chills.  You have nausea or vomiting.  You have continued burning or discomfort with urination. MAKE SURE YOU:   Understand these instructions.  Will watch your condition.  Will get help right away if you are not doing well or get worse. Document Released: 04/01/2005 Document Revised: 12/22/2011 Document Reviewed: 07/31/2011 [facility] Patient Information 2015 [place], [place]. This information is not intended to replace advice given to you by your health care provider. Make sure you discuss any questions you have with your health care provider.

## 2014-11-03 NOTE — ED Notes (Signed)
MD at bedside. 

## 2014-11-03 NOTE — ED Notes (Signed)
Patient states that she is having a vaginal discharge with odor x 1 week.

## 2014-11-03 NOTE — ED Provider Notes (Signed)
CSN: [id number]     Arrival date & time 11/03/14  1806 History  This chart was scribed for Kendra Koch Eunique Balik, MD by Roxy Cedarhandni Bhalodia, ED Scribe. This patient was seen in room MH01/MH01 and the patient's care was started at 6:39 PM.   Chief Complaint  Patient presents with  . Vaginal Discharge   Patient is a 19 y.o. female presenting with vaginal discharge. The history is provided by the patient. No language interpreter was used.  Vaginal Discharge Severity:  Moderate Onset quality:  Gradual Duration:  1 week Timing:  Constant Progression:  Unchanged Chronicity:  New Relieved by:  Nothing Worsened by:  Nothing tried Ineffective treatments:  None tried Associated symptoms: vaginal itching   Associated symptoms: no abdominal pain and no dysuria    HPI Comments: Kendra Koch is a 19 y.o. female with a PMHx of bacterial vaginosis, who presents to the Emergency Department complaining of moderate vaginal discharge onset 1 week ago. She reports associated vaginal itching. Patient states her LNMP was on 10/20/14. She states that she is sexually active and reports possible chance of pregnancy. She denies presence of new partners or partners with symptoms of STDs. She states that she was recently sexually active and took a plan B pill. She denies associated vaginal bleeding or dysuria.  History reviewed. No pertinent past medical history. Past Surgical History  Procedure Laterality Date  . Tonsillectomy     History reviewed. No pertinent family history. History  Substance Use Topics  . Smoking status: Never Smoker   . Smokeless tobacco: Not on file  . Alcohol Use: No   OB History    No data available     Review of Systems  Gastrointestinal: Negative for abdominal pain.  Genitourinary: Positive for vaginal discharge. Negative for dysuria.   A complete 10 system review of systems was obtained and all systems are negative except as noted in the HPI and PMH.    Allergies  Review of  patient's allergies indicates no known allergies.  Home Medications   Prior to Admission medications   Medication Sig Start Date End Date Taking? Authorizing Provider  ibuprofen (ADVIL,MOTRIN) 800 MG tablet Take 1 tablet (800 mg total) by mouth 3 (three) times daily. 10/05/14   Jerelyn ScottMartha Linker, MD  LABETALOL HCL PO Take by mouth.    Historical Provider, MD  metroNIDAZOLE (FLAGYL) 500 MG tablet Take 1 tablet (500 mg total) by mouth 2 (two) times daily. One po bid x 7 days 11/03/14   Kendra Koch Canda Podgorski, MD  ondansetron (ZOFRAN) 4 MG tablet Take 1 tablet (4 mg total) by mouth every 6 (six) hours. 10/05/14   Jerelyn ScottMartha Linker, MD  sulfamethoxazole-trimethoprim (BACTRIM DS,SEPTRA DS) 800-160 MG per tablet Take 1 tablet by mouth 2 (two) times daily. 11/03/14 11/10/14  Kendra Koch Soraiya Ahner, MD   Triage Vitals: BP 132/75 mmHg  Pulse 98  Temp(Src) 98.7 F (37.1 C) (Oral)  Resp 18  Ht 5\' 3"  (1.6 m)  Wt 222 lb (100.699 kg)  BMI 39.34 kg/m2  SpO2 100%  LMP 10/22/2014 (LMP Unknown)  Physical Exam  Constitutional: She is oriented to person, place, and time. She appears well-developed and well-nourished. No distress.  HENT:  Head: Normocephalic and atraumatic.  Eyes: Conjunctivae and EOM are normal.  Neck: Neck supple. No tracheal deviation present.  Cardiovascular: Normal rate, regular rhythm and normal heart sounds.   No murmur heard. Pulmonary/Chest: Effort normal and breath sounds normal. No respiratory distress.  Abdominal: Soft. There is no tenderness.  Musculoskeletal:  Normal range of motion.  Neurological: She is alert and oriented to person, place, and time.  Skin: Skin is warm and dry.  Psychiatric: She has a normal mood and affect. Her behavior is normal.  Nursing note and vitals reviewed.  ED Course  Procedures (including critical care time)  DIAGNOSTIC STUDIES: Oxygen Saturation is 100% on RA, normal by my interpretation.    COORDINATION OF CARE: 6:40 PM- Discussed plans to perform pelvic exam. Pt  advised of plan for treatment and pt agrees.  Labs Review Labs Reviewed  URINALYSIS, ROUTINE W REFLEX MICROSCOPIC - Abnormal; Notable for the following:    APPearance CLOUDY (*)    Bilirubin Urine SMALL (*)    Ketones, ur 15 (*)    Leukocytes, UA MODERATE (*)    All other components within normal limits  URINE MICROSCOPIC-ADD ON - Abnormal; Notable for the following:    Bacteria, UA MANY (*)    All other components within normal limits  WET PREP, GENITAL  PREGNANCY, URINE  GC/CHLAMYDIA PROBE AMP ([place])   Imaging Review No results found.   EKG Interpretation None     MDM   Final diagnoses:  Urinary tract infection without hematuria, site unspecified  Bacterial vaginosis    Wet prep reveals too numerous to count white cells, many clue cells, and urinalysis reveals evidence for UTI. She will be treated presumptively for GC/chlamydia. She will also be given Flagyl for BV and Bactrim for her UTI.  I personally performed the services described in this documentation, which was scribed in my presence. The recorded information has been reviewed and is accurate.    Kendra Lyons, MD 11/03/14 2159

## 2014-11-05 LAB — GC/CHLAMYDIA PROBE AMP (~~LOC~~) NOT AT ARMC
Chlamydia: POSITIVE — AB
NEISSERIA GONORRHEA: POSITIVE — AB

## 2014-11-06 ENCOUNTER — Telehealth (HOSPITAL_COMMUNITY): Payer: Self-pay

## 2014-11-06 NOTE — ED Notes (Signed)
Informed of lab results. Advised to abstain from sexual activity x 10 days and to notify partner(s) for testing and treatment

## 2014-11-06 NOTE — ED Notes (Signed)
Positive for gonorrhea and chlamydia. Treated per protocol. Attempting to contact pt.  

## 2015-01-31 ENCOUNTER — Emergency Department (HOSPITAL_BASED_OUTPATIENT_CLINIC_OR_DEPARTMENT_OTHER): Payer: Medicaid Other

## 2015-01-31 ENCOUNTER — Encounter (HOSPITAL_BASED_OUTPATIENT_CLINIC_OR_DEPARTMENT_OTHER): Payer: Self-pay | Admitting: *Deleted

## 2015-01-31 ENCOUNTER — Emergency Department (HOSPITAL_BASED_OUTPATIENT_CLINIC_OR_DEPARTMENT_OTHER)
Admission: EM | Admit: 2015-01-31 | Discharge: 2015-01-31 | Disposition: A | Discharge status: Home / Self Care | Payer: Medicaid Other | Attending: Emergency Medicine | Admitting: Emergency Medicine

## 2015-01-31 DIAGNOSIS — Y998 Other external cause status: Secondary | ICD-10-CM | POA: Diagnosis not present

## 2015-01-31 DIAGNOSIS — S99912A Unspecified injury of left ankle, initial encounter: Secondary | ICD-10-CM | POA: Diagnosis present

## 2015-01-31 DIAGNOSIS — Y92812 Truck as the place of occurrence of the external cause: Secondary | ICD-10-CM | POA: Diagnosis not present

## 2015-01-31 DIAGNOSIS — S93602A Unspecified sprain of left foot, initial encounter: Secondary | ICD-10-CM | POA: Insufficient documentation

## 2015-01-31 DIAGNOSIS — I10 Essential (primary) hypertension: Secondary | ICD-10-CM | POA: Insufficient documentation

## 2015-01-31 DIAGNOSIS — Z791 Long term (current) use of non-steroidal anti-inflammatories (NSAID): Secondary | ICD-10-CM | POA: Insufficient documentation

## 2015-01-31 DIAGNOSIS — Y9301 Activity, walking, marching and hiking: Secondary | ICD-10-CM | POA: Insufficient documentation

## 2015-01-31 DIAGNOSIS — S93402A Sprain of unspecified ligament of left ankle, initial encounter: Secondary | ICD-10-CM | POA: Diagnosis not present

## 2015-01-31 DIAGNOSIS — W1839XA Other fall on same level, initial encounter: Secondary | ICD-10-CM | POA: Insufficient documentation

## 2015-01-31 HISTORY — DX: Essential (primary) hypertension: I10

## 2015-01-31 MED ORDER — IBUPROFEN 800 MG PO TABS
800.0000 mg | ORAL_TABLET | Freq: Once | ORAL | Status: AC
  Administered 2015-01-31: 800 mg via ORAL
  Filled 2015-01-31: qty 1
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
## 2015-01-31 NOTE — ED Notes (Signed)
MD at bedside. 
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
## 2015-01-31 NOTE — Discharge Instructions (Signed)

## 2015-01-31 NOTE — ED Notes (Signed)
John EMT working with Pt. In crutch walking at present time.

## 2015-04-10 ENCOUNTER — Emergency Department (HOSPITAL_BASED_OUTPATIENT_CLINIC_OR_DEPARTMENT_OTHER): Payer: Medicaid Other

## 2015-04-10 ENCOUNTER — Emergency Department (HOSPITAL_BASED_OUTPATIENT_CLINIC_OR_DEPARTMENT_OTHER)
Admission: EM | Admit: 2015-04-10 | Discharge: 2015-04-10 | Disposition: A | Discharge status: Home / Self Care | Payer: Medicaid Other | Attending: Emergency Medicine | Admitting: Emergency Medicine

## 2015-04-10 ENCOUNTER — Encounter (HOSPITAL_BASED_OUTPATIENT_CLINIC_OR_DEPARTMENT_OTHER): Payer: Self-pay | Admitting: Emergency Medicine

## 2015-04-10 DIAGNOSIS — Z3A08 8 weeks gestation of pregnancy: Secondary | ICD-10-CM | POA: Insufficient documentation

## 2015-04-10 DIAGNOSIS — O9A211 Injury, poisoning and certain other consequences of external causes complicating pregnancy, first trimester: Secondary | ICD-10-CM | POA: Diagnosis present

## 2015-04-10 DIAGNOSIS — Y998 Other external cause status: Secondary | ICD-10-CM | POA: Insufficient documentation

## 2015-04-10 DIAGNOSIS — S8992XA Unspecified injury of left lower leg, initial encounter: Secondary | ICD-10-CM | POA: Insufficient documentation

## 2015-04-10 DIAGNOSIS — S29001A Unspecified injury of muscle and tendon of front wall of thorax, initial encounter: Secondary | ICD-10-CM | POA: Diagnosis not present

## 2015-04-10 DIAGNOSIS — O10011 Pre-existing essential hypertension complicating pregnancy, first trimester: Secondary | ICD-10-CM | POA: Insufficient documentation

## 2015-04-10 DIAGNOSIS — Y9389 Activity, other specified: Secondary | ICD-10-CM | POA: Diagnosis not present

## 2015-04-10 DIAGNOSIS — Y92410 Unspecified street and highway as the place of occurrence of the external cause: Secondary | ICD-10-CM | POA: Insufficient documentation

## 2015-04-10 DIAGNOSIS — Z79899 Other long term (current) drug therapy: Secondary | ICD-10-CM | POA: Insufficient documentation

## 2015-04-10 LAB — PREGNANCY, URINE: PREG TEST UR: POSITIVE — AB

## 2015-04-10 MED ORDER — ACETAMINOPHEN 500 MG PO TABS
1000.0000 mg | ORAL_TABLET | Freq: Once | ORAL | Status: AC
  Administered 2015-04-10: 1000 mg via ORAL
  Filled 2015-04-10: qty 2
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
## 2015-04-10 NOTE — Discharge Instructions (Signed)
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
## 2015-04-22 ENCOUNTER — Ambulatory Visit (HOSPITAL_BASED_OUTPATIENT_CLINIC_OR_DEPARTMENT_OTHER)
Admission: RE | Admit: 2015-04-22 | Discharge: 2015-04-22 | Disposition: A | Discharge status: Home / Self Care | Payer: Medicaid Other | Source: Ambulatory Visit | Attending: Emergency Medicine | Admitting: Emergency Medicine

## 2015-04-22 ENCOUNTER — Other Ambulatory Visit (HOSPITAL_BASED_OUTPATIENT_CLINIC_OR_DEPARTMENT_OTHER): Payer: Self-pay | Admitting: Emergency Medicine

## 2015-04-22 ENCOUNTER — Emergency Department (HOSPITAL_BASED_OUTPATIENT_CLINIC_OR_DEPARTMENT_OTHER)
Admission: EM | Admit: 2015-04-22 | Discharge: 2015-04-22 | Disposition: A | Discharge status: Home / Self Care | Payer: Medicaid Other | Attending: Emergency Medicine | Admitting: Emergency Medicine

## 2015-04-22 ENCOUNTER — Encounter (HOSPITAL_BASED_OUTPATIENT_CLINIC_OR_DEPARTMENT_OTHER): Payer: Self-pay | Admitting: Emergency Medicine

## 2015-04-22 ENCOUNTER — Emergency Department (HOSPITAL_BASED_OUTPATIENT_CLINIC_OR_DEPARTMENT_OTHER): Payer: Medicaid Other

## 2015-04-22 DIAGNOSIS — O10911 Unspecified pre-existing hypertension complicating pregnancy, first trimester: Secondary | ICD-10-CM | POA: Diagnosis not present

## 2015-04-22 DIAGNOSIS — Z3491 Encounter for supervision of normal pregnancy, unspecified, first trimester: Secondary | ICD-10-CM

## 2015-04-22 DIAGNOSIS — N939 Abnormal uterine and vaginal bleeding, unspecified: Secondary | ICD-10-CM

## 2015-04-22 DIAGNOSIS — O209 Hemorrhage in early pregnancy, unspecified: Secondary | ICD-10-CM

## 2015-04-22 DIAGNOSIS — Z791 Long term (current) use of non-steroidal anti-inflammatories (NSAID): Secondary | ICD-10-CM | POA: Insufficient documentation

## 2015-04-22 DIAGNOSIS — Z3A01 Less than 8 weeks gestation of pregnancy: Secondary | ICD-10-CM | POA: Insufficient documentation

## 2015-04-22 LAB — GC/CHLAMYDIA PROBE AMP (~~LOC~~) NOT AT ARMC
CHLAMYDIA, DNA PROBE: NEGATIVE
NEISSERIA GONORRHEA: NEGATIVE

## 2015-04-22 LAB — URINE MICROSCOPIC-ADD ON

## 2015-04-22 LAB — URINALYSIS, ROUTINE W REFLEX MICROSCOPIC
Bilirubin Urine: NEGATIVE
Glucose, UA: NEGATIVE mg/dL
KETONES UR: 15 mg/dL — AB
Nitrite: NEGATIVE
PH: 5.5 (ref 5.0–8.0)
Protein, ur: 30 mg/dL — AB
Specific Gravity, Urine: 1.025 (ref 1.005–1.030)
Urobilinogen, UA: 0.2 mg/dL (ref 0.0–1.0)

## 2015-04-22 LAB — PREGNANCY, URINE: PREG TEST UR: POSITIVE — AB

## 2015-04-22 LAB — HCG, QUANTITATIVE, PREGNANCY: HCG, BETA CHAIN, QUANT, S: 661 m[IU]/mL — AB (ref <5)

## 2015-04-22 NOTE — ED Notes (Signed)
Pelvic cart & ultrasound at bedside, MD made aware.

## 2015-04-22 NOTE — ED Notes (Addendum)
Patient states that she found out on Friday that she is about [redacted] weeks pregnant. The patient reports that she spotted some yesterday but then after having sexual intercourse last night she woke up this am and is having further bleeding. Patient states that she has worn panty liner and has gone through one.
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
## 2015-04-22 NOTE — ED Provider Notes (Addendum)
CSN: [id number]     Arrival date & time 04/22/15  [id number] History   First MD Initiated Contact with Patient 04/22/15 [phone]     Chief Complaint  Patient presents with  . Vaginal Bleeding     (Consider location/radiation/quality/duration/timing/severity/associated sxs/prior Treatment) Patient is a 19 y.o. female presenting with vaginal bleeding. The history is provided by the patient.  Vaginal Bleeding Quality:  Dark red and lighter than menses Severity:  Moderate Onset quality:  Sudden Duration:  4 hours Timing:  Constant Progression:  Improving Chronicity:  New Number of pads used:  1 panie liner Possible pregnancy: yes   Context: after intercourse   Context comment:  Noticed minimal spotting yesterday but stopped and then bleeding started after intercourse last night Relieved by:  None tried Worsened by:  Intercourse Ineffective treatments:  None tried Associated symptoms: no abdominal pain, no dyspareunia, no dysuria, no fever and no nausea   Associated symptoms comment:  Noticed some mild vaginal d/c over the last 1 week.  No itching, burning or lesions Risk factors: unprotected sex   Risk factors: does not have multiple partners, no new sexual partner and no prior miscarriage   Risk factors comment:  Told she was [facility] [redacted] week [facility]   Past Medical History  Diagnosis Date  . Hypertension    Past Surgical History  Procedure Laterality Date  . Tonsillectomy    . Mouth surgery     History reviewed. No pertinent family history. Social History  Substance Use Topics  . Smoking status: Never Smoker   . Smokeless tobacco: None  . Alcohol Use: No   OB History    No data available     Review of Systems  Constitutional: Negative for fever.  Gastrointestinal: Negative for nausea and abdominal pain.  Genitourinary: Positive for vaginal bleeding. Negative for dysuria and dyspareunia.  All other systems reviewed and are negative.     Allergies   Review of patient's allergies indicates no known allergies.  Home Medications   Prior to Admission medications   Medication Sig Start Date End Date Taking? Authorizing Provider  ibuprofen (ADVIL,MOTRIN) 800 MG tablet Take 1 tablet (800 mg total) by mouth 3 (three) times daily. 10/05/14   Jerelyn ScottMartha Linker, MD  LABETALOL HCL PO Take by mouth.    Historical Provider, MD  metroNIDAZOLE (FLAGYL) 500 MG tablet Take 1 tablet (500 mg total) by mouth 2 (two) times daily. One po bid x 7 days 11/03/14   Geoffery Lyonsouglas Delo, MD  ondansetron (ZOFRAN) 4 MG tablet Take 1 tablet (4 mg total) by mouth every 6 (six) hours. 10/05/14   Jerelyn ScottMartha Linker, MD   BP 124/72 mmHg  Pulse 80  Temp(Src) 98.3 F (36.8 C) (Oral)  Resp 20  Ht 5\' 3"  (1.6 m)  Wt 208 lb (94.348 kg)  BMI 36.85 kg/m2  SpO2 100%  LMP 03/13/2015 (Approximate) Physical Exam  Constitutional: She is oriented to person, place, and time. She appears well-developed and well-nourished. No distress.  HENT:  Head: Normocephalic and atraumatic.  Mouth/Throat: Oropharynx is clear and moist.  Eyes: Conjunctivae and EOM are normal. Pupils are equal, round, and reactive to light.  Neck: Normal range of motion. Neck supple.  Cardiovascular: Normal rate, regular rhythm and intact distal pulses.   No murmur heard. Pulmonary/Chest: Effort normal and breath sounds normal. No respiratory distress. She has no wheezes. She has no rales.  Abdominal: Soft. She exhibits no distension. There is no tenderness. There is  no rebound and no guarding.  Genitourinary: Cervix exhibits no motion tenderness and no friability. Right adnexum displays no mass and no tenderness. Left adnexum displays no mass and no tenderness. There is bleeding in the vagina.  Minimal amount of dark blood with few clots present in the posterior fornix.  Cervix is thick high and closed.  Musculoskeletal: Normal range of motion. She exhibits no edema or tenderness.  Neurological: She is alert and oriented to  person, place, and time.  Skin: Skin is warm and dry. No rash noted. No erythema.  Psychiatric: She has a normal mood and affect. Her behavior is normal.  Nursing note and vitals reviewed.   ED Course  Procedures (including critical care time) Labs Review Labs Reviewed  PREGNANCY, URINE - Abnormal; Notable for the following:    Preg Test, Ur POSITIVE (*)    All other components within normal limits  URINALYSIS, ROUTINE W REFLEX MICROSCOPIC (NOT AT [facility]) - Abnormal; Notable for the following:    Color, Urine RED (*)    APPearance TURBID (*)    Hgb urine dipstick LARGE (*)    Ketones, ur 15 (*)    Protein, ur 30 (*)    Leukocytes, UA MODERATE (*)    All other components within normal limits  URINE MICROSCOPIC-ADD ON - Abnormal; Notable for the following:    Squamous Epithelial / LPF MANY (*)    Bacteria, UA MANY (*)    All other components within normal limits  HCG, QUANTITATIVE, PREGNANCY  RPR  HIV ANTIBODY (ROUTINE TESTING)  GC/CHLAMYDIA PROBE AMP ([place]) NOT AT [facility]    Imaging Review No results found. I have personally reviewed and evaluated these images and lab results as part of my medical decision-making.   EKG Interpretation None     EMERGENCY DEPARTMENT [place] PREGNANCY "Study: Limited Ultrasound of the Pelvis"  INDICATIONS:Vaginal bleeding Multiple views of the uterus and pelvic cavity are obtained with a multi-frequency probe.  APPROACH:Transvaginal  PERFORMED BY: Myself  IMAGES ARCHIVED?: Yes  LIMITATIONS: bowel gas and no clear view of uterus  PREGNANCY FREE FLUID: None  PREGNANCY UTERUS FINDINGS:Uterus enlarged and Non-specific intrauterine fluid noted ADNEXAL FINDINGS:bilateral ovaries appear normal  PREGNANCY FINDINGS: No fetal heart activity seen, No yolk sac noted and No fetal pole seen  INTERPRETATION: No visualized intrauterine pregnancy  GESTATIONAL AGE, ESTIMATE: 5w by dates  FETAL HEART RATE:   COMMENT(Estimate of Gestational  Age):  Unable to identify IUP    MDM   Final diagnoses:  Vaginal bleeding  First trimester pregnancy    Pt is G2P1001 who presents today with vaginal bleeding.  No abd pain but bleeding started after intercourse last night.   Also noted vaginal discharge in the last week.  Only 1 partner and occassional protection.  Prior hx of STI that was treated within the last 1 year.  Pt told she was pregnant about 2 weeks ago but no work up done at that time due to being unrelated to complaint.   On exam pt has no abd pain but does have mild dark red vaginal bleeding.  Few clots present without cervical or adnexal tenderness.  HCG pending.  [place], GC/Chlamydia, HIV and RPR pending.  No prior hx of receiving rhogam with prior pregnancy.  8:42 AM Bedside ultrasound with no definitive IUP. Will have patient return for formal ultrasound at 11:30  Gwyneth Sprout, MD 04/22/15 [id number]  Gwyneth Sprout, MD 04/22/15 0848  2:20 PM Patient return for ultrasound and at that  time no IUP visualized. Patient's hCG was 661. Suspect most likely spontaneous abortion. Will have patient follow-up with OB/GYN for repeat Quant.  Gwyneth Sprout, MD 04/22/15 1420

## 2015-04-23 LAB — RPR: RPR Ser Ql: NONREACTIVE

## 2015-04-23 LAB — HIV ANTIBODY (ROUTINE TESTING W REFLEX): HIV SCREEN 4TH GENERATION: NONREACTIVE

## 2017-01-07 IMAGING — US US OB COMP LESS 14 WK
1 series · 14 of 28 positions shown · non-contrast
Comparison: None.

CLINICAL DATA: Vaginal bleeding. Positive pregnancy test.
Gestational age by LMP of 5 weeks 5 days.

EXAM:
OBSTETRIC <14 WK US AND TRANSVAGINAL OB US
TECHNIQUE: Both transabdominal and transvaginal ultrasound examinations were
performed for complete evaluation of the gestation as well as the
maternal uterus, adnexal regions, and pelvic cul-de-sac.
Transvaginal technique was performed to assess early pregnancy.

[Series 1: us ob comp less 14 wk · 0.24mm/px · 14 of 36 slices shown]
[im 2/36]
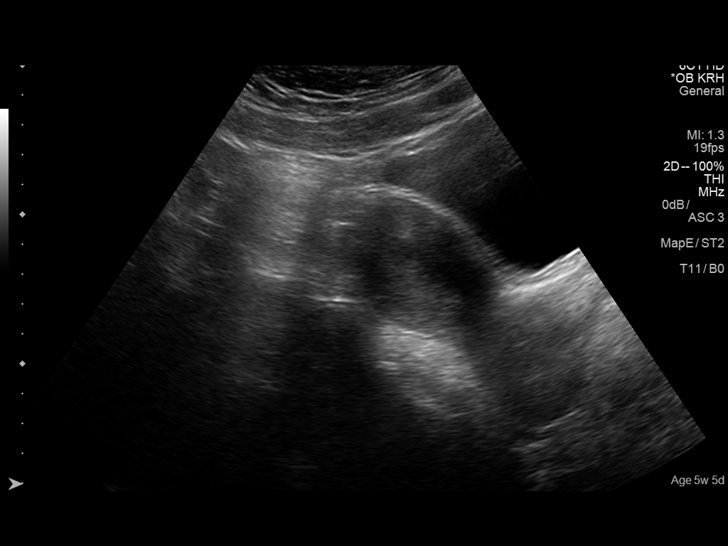
[im 4/36]
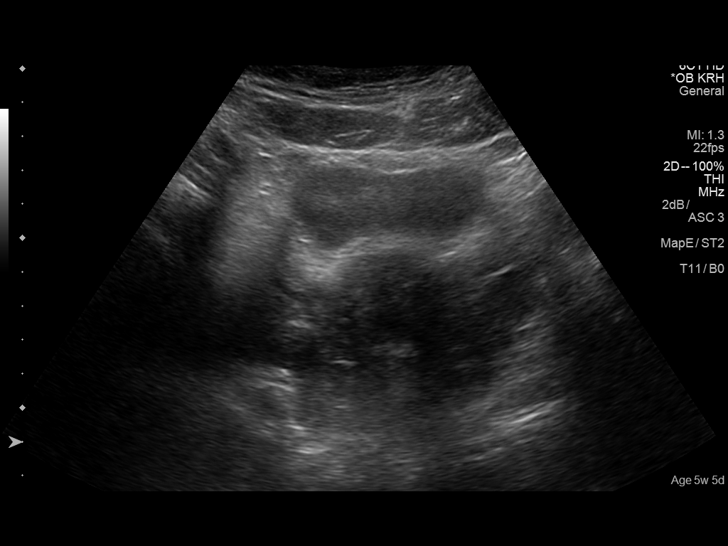
[im 7/36]
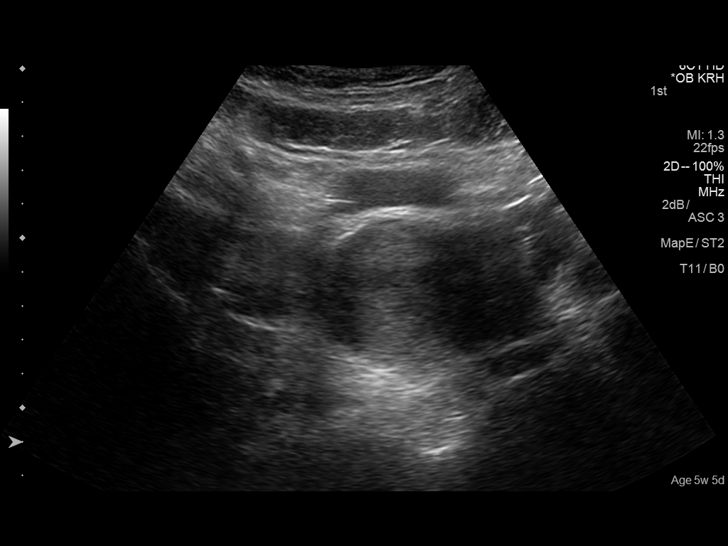
[im 10/36]
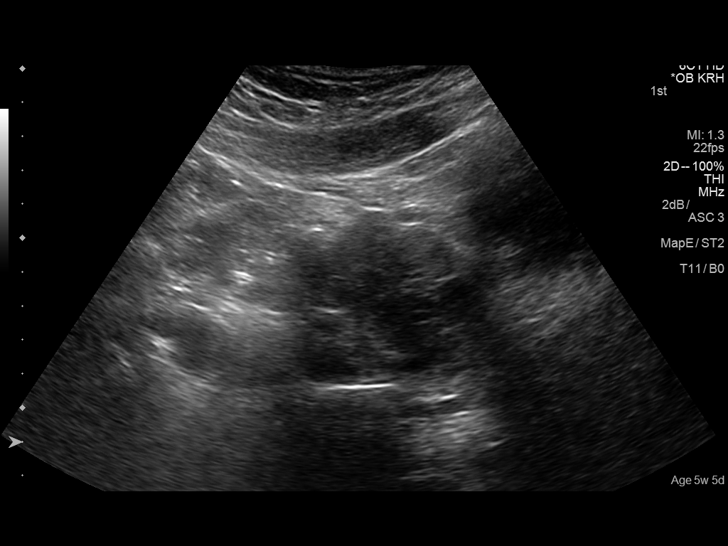
[im 12/36]
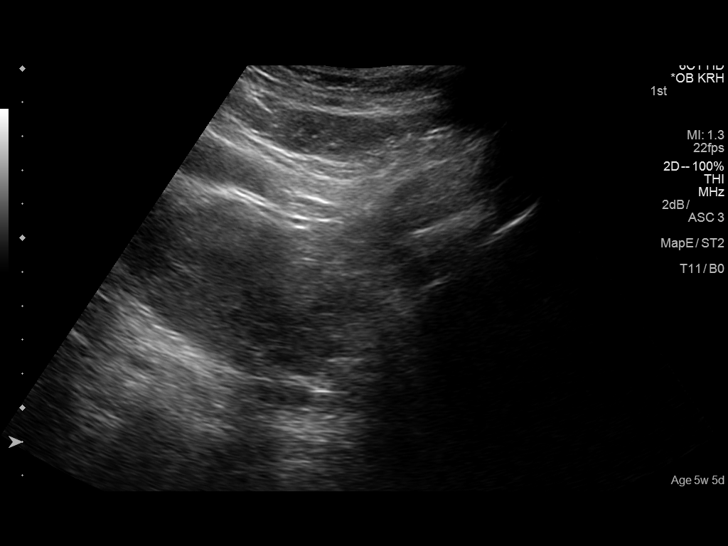
[im 15/36]
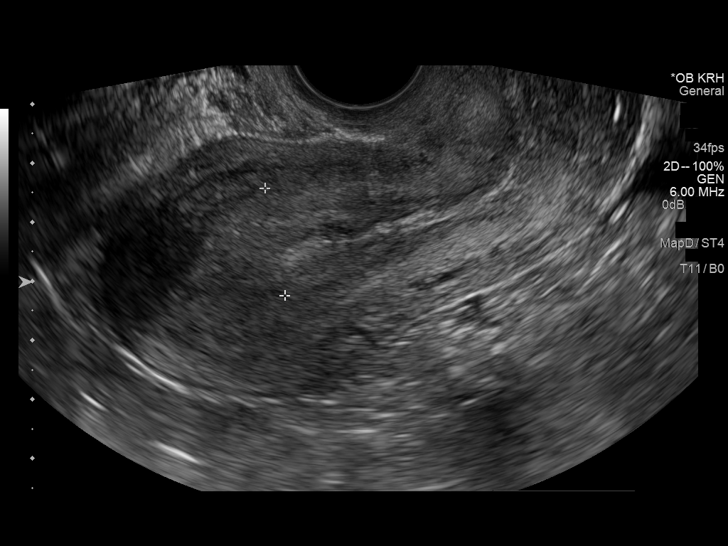
[im 17/36]
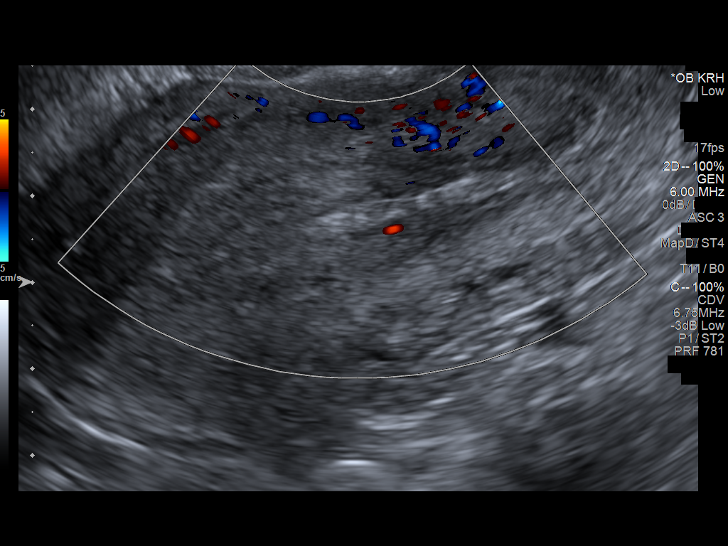
[im 20/36]
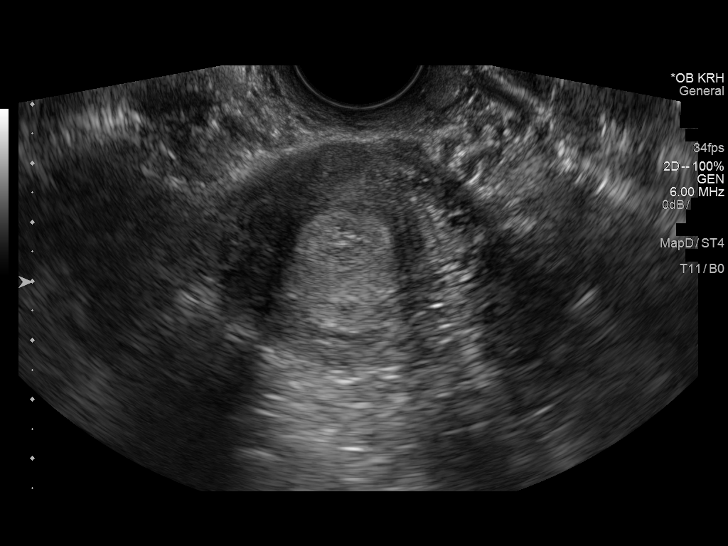
[im 23/36]
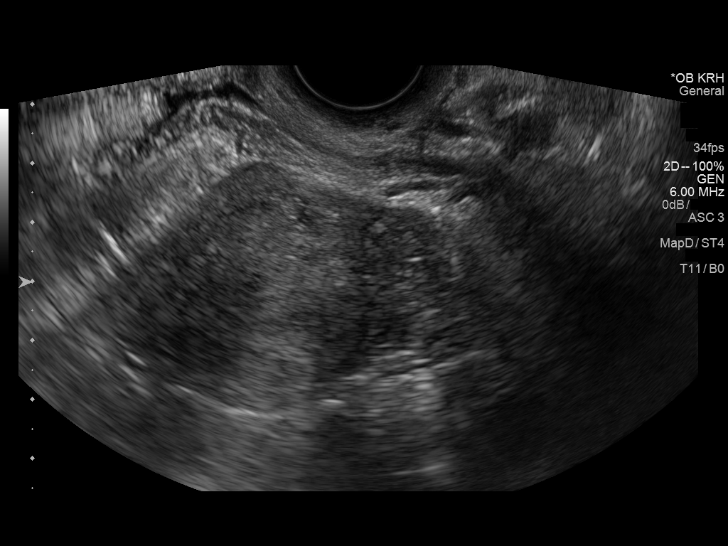
[im 25/36]
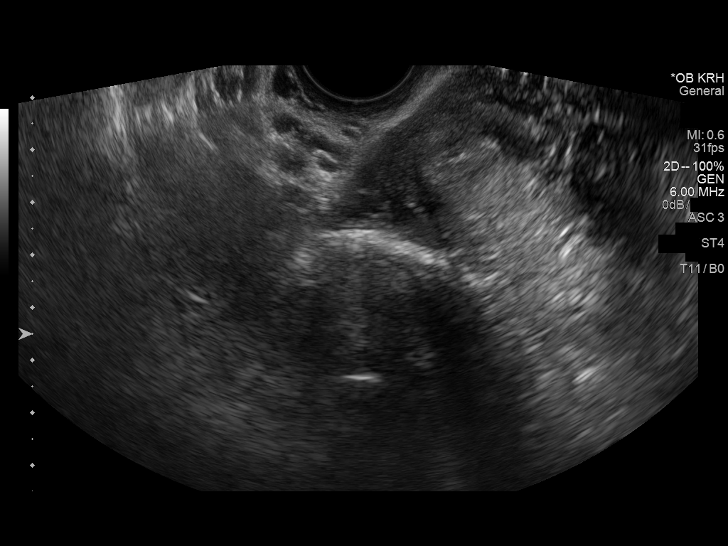
[im 28/36]
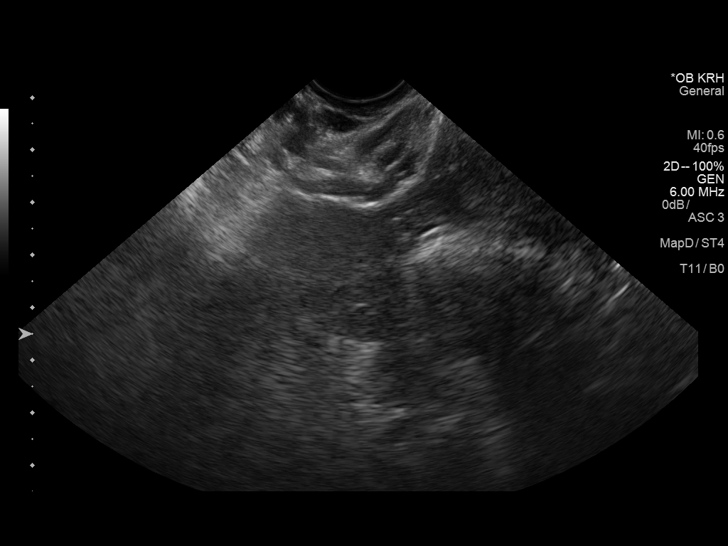
[im 30/36]
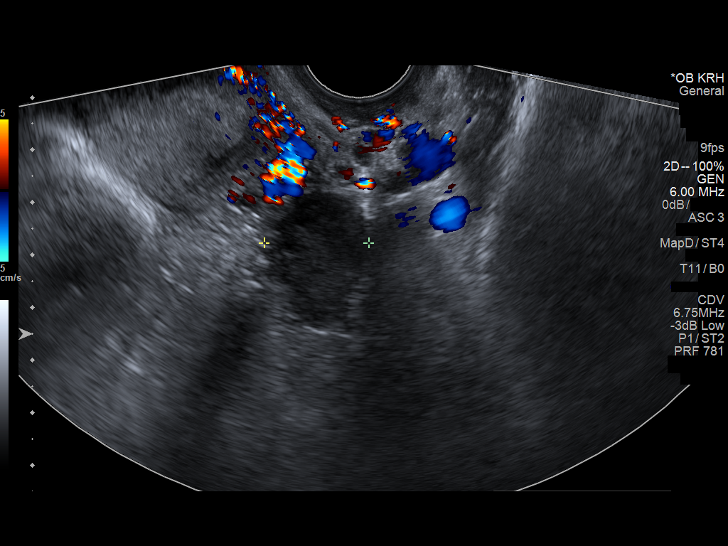
[im 33/36]
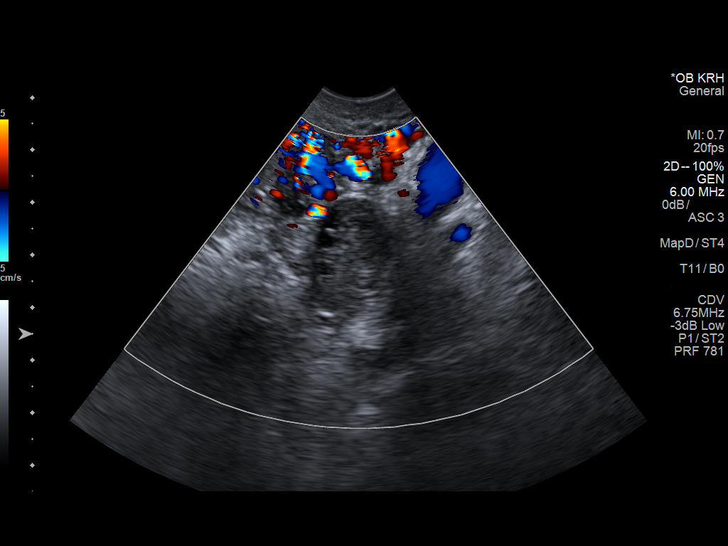
[im 36/36]
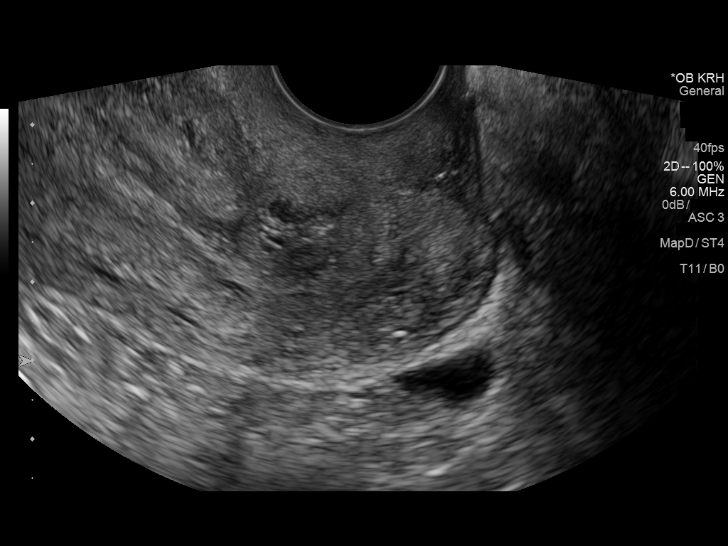

[14 of 28 positions shown; findings below may reference images not displayed]

FINDINGS: No intrauterine gestational sac or other fluid collection visualized
in endometrial cavity. Endometrial thickness measures 19 mm. No
fibroids identified.

Both ovaries are normal in appearance. No adnexal mass or free fluid
identified.
IMPRESSION: Pregnancy location not visualized sonographically. Differential
diagnosis includes recent spontaneous abortion, IUP too early to
visualize, and occult ectopic pregnancy. Recommend close follow up
of quantitative B-HCG levels, and follow up US as clinically
warranted.
